# Patient Record
Sex: Female | Born: 1985 | Race: White | Hispanic: No | Marital: Married | Smoking: Never smoker
Health system: Southern US, Community
[De-identification: ages and names within clinical notes are randomized; demographics above are authoritative.]

## PROBLEM LIST (undated history)

## (undated) DIAGNOSIS — Z1379 Encounter for other screening for genetic and chromosomal anomalies: Secondary | ICD-10-CM

## (undated) DIAGNOSIS — Z803 Family history of malignant neoplasm of breast: Secondary | ICD-10-CM

## (undated) DIAGNOSIS — Z9189 Other specified personal risk factors, not elsewhere classified: Secondary | ICD-10-CM

## (undated) HISTORY — DX: Family history of malignant neoplasm of breast: Z80.3

## (undated) HISTORY — DX: Other specified personal risk factors, not elsewhere classified: Z91.89

## (undated) HISTORY — DX: Encounter for other screening for genetic and chromosomal anomalies: Z13.79

---
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
## 2004-10-09 ENCOUNTER — Other Ambulatory Visit: Admission: RE | Admit: 2004-10-09 | Discharge: 2004-10-09 | Payer: Self-pay | Admitting: Obstetrics & Gynecology

## 2014-05-05 HISTORY — PX: INTRAUTERINE DEVICE (IUD) INSERTION: SHX5877

## 2015-12-04 DIAGNOSIS — Z1379 Encounter for other screening for genetic and chromosomal anomalies: Secondary | ICD-10-CM

## 2015-12-04 DIAGNOSIS — Z9189 Other specified personal risk factors, not elsewhere classified: Secondary | ICD-10-CM

## 2015-12-04 HISTORY — DX: Encounter for other screening for genetic and chromosomal anomalies: Z13.79

## 2015-12-04 HISTORY — DX: Other specified personal risk factors, not elsewhere classified: Z91.89

## 2016-01-16 ENCOUNTER — Other Ambulatory Visit: Payer: Self-pay | Admitting: Obstetrics and Gynecology

## 2016-01-16 DIAGNOSIS — Z803 Family history of malignant neoplasm of breast: Secondary | ICD-10-CM

## 2016-01-16 DIAGNOSIS — Z9189 Other specified personal risk factors, not elsewhere classified: Secondary | ICD-10-CM

## 2016-01-24 ENCOUNTER — Other Ambulatory Visit: Payer: Self-pay | Admitting: Obstetrics and Gynecology

## 2016-01-24 ENCOUNTER — Ambulatory Visit
Admission: RE | Admit: 2016-01-24 | Discharge: 2016-01-24 | Disposition: A | Discharge status: Home / Self Care | Payer: BLUE CROSS/BLUE SHIELD | Source: Ambulatory Visit | Attending: Obstetrics and Gynecology | Admitting: Obstetrics and Gynecology

## 2016-01-24 DIAGNOSIS — Z1231 Encounter for screening mammogram for malignant neoplasm of breast: Secondary | ICD-10-CM | POA: Diagnosis present

## 2016-01-24 DIAGNOSIS — Z803 Family history of malignant neoplasm of breast: Secondary | ICD-10-CM

## 2016-01-24 DIAGNOSIS — Z9189 Other specified personal risk factors, not elsewhere classified: Secondary | ICD-10-CM

## 2016-01-29 ENCOUNTER — Other Ambulatory Visit: Payer: Self-pay | Admitting: Obstetrics and Gynecology

## 2016-01-29 DIAGNOSIS — R928 Other abnormal and inconclusive findings on diagnostic imaging of breast: Secondary | ICD-10-CM

## 2016-02-09 ENCOUNTER — Ambulatory Visit
Admission: RE | Admit: 2016-02-09 | Discharge: 2016-02-09 | Disposition: A | Discharge status: Home / Self Care | Payer: BLUE CROSS/BLUE SHIELD | Source: Ambulatory Visit | Attending: Obstetrics and Gynecology | Admitting: Obstetrics and Gynecology

## 2016-02-09 DIAGNOSIS — R928 Other abnormal and inconclusive findings on diagnostic imaging of breast: Secondary | ICD-10-CM | POA: Diagnosis present

## 2016-02-09 DIAGNOSIS — N6310 Unspecified lump in the right breast, unspecified quadrant: Secondary | ICD-10-CM | POA: Insufficient documentation

## 2016-02-09 DIAGNOSIS — Z803 Family history of malignant neoplasm of breast: Secondary | ICD-10-CM | POA: Insufficient documentation

## 2016-02-09 DIAGNOSIS — N63 Unspecified lump in breast: Secondary | ICD-10-CM | POA: Insufficient documentation

## 2016-02-16 ENCOUNTER — Other Ambulatory Visit: Payer: Self-pay | Admitting: Obstetrics and Gynecology

## 2016-02-16 DIAGNOSIS — N6009 Solitary cyst of unspecified breast: Secondary | ICD-10-CM

## 2016-08-12 ENCOUNTER — Ambulatory Visit: Payer: BLUE CROSS/BLUE SHIELD

## 2016-08-13 ENCOUNTER — Ambulatory Visit
Admission: RE | Admit: 2016-08-13 | Discharge: 2016-08-13 | Disposition: A | Discharge status: Home / Self Care | Payer: BLUE CROSS/BLUE SHIELD | Source: Ambulatory Visit | Attending: Obstetrics and Gynecology | Admitting: Obstetrics and Gynecology

## 2016-08-13 DIAGNOSIS — N6009 Solitary cyst of unspecified breast: Secondary | ICD-10-CM | POA: Diagnosis present

## 2016-08-13 DIAGNOSIS — N6312 Unspecified lump in the right breast, upper inner quadrant: Secondary | ICD-10-CM | POA: Insufficient documentation

## 2016-08-16 ENCOUNTER — Other Ambulatory Visit: Payer: Self-pay | Admitting: Obstetrics and Gynecology

## 2016-08-16 DIAGNOSIS — N63 Unspecified lump in unspecified breast: Secondary | ICD-10-CM

## 2016-12-24 ENCOUNTER — Encounter: Payer: Self-pay | Admitting: Obstetrics and Gynecology

## 2016-12-24 ENCOUNTER — Ambulatory Visit (INDEPENDENT_AMBULATORY_CARE_PROVIDER_SITE_OTHER): Payer: BLUE CROSS/BLUE SHIELD | Admitting: Obstetrics and Gynecology

## 2016-12-24 VITALS — BP 126/72 | Ht 66.0 in | Wt 244.0 lb

## 2016-12-24 DIAGNOSIS — Z9189 Other specified personal risk factors, not elsewhere classified: Secondary | ICD-10-CM

## 2016-12-24 DIAGNOSIS — Z1231 Encounter for screening mammogram for malignant neoplasm of breast: Secondary | ICD-10-CM

## 2016-12-24 DIAGNOSIS — Z30431 Encounter for routine checking of intrauterine contraceptive device: Secondary | ICD-10-CM

## 2016-12-24 DIAGNOSIS — N6310 Unspecified lump in the right breast, unspecified quadrant: Secondary | ICD-10-CM | POA: Diagnosis not present

## 2016-12-24 DIAGNOSIS — Z803 Family history of malignant neoplasm of breast: Secondary | ICD-10-CM

## 2016-12-24 DIAGNOSIS — Z01419 Encounter for gynecological examination (general) (routine) without abnormal findings: Secondary | ICD-10-CM

## 2016-12-24 DIAGNOSIS — Z1239 Encounter for other screening for malignant neoplasm of breast: Secondary | ICD-10-CM

## 2016-12-24 NOTE — Progress Notes (Signed)
Chief Complaint  Patient presents with  . Annual Exam     HPI:      [name] is a 31 y.o. No obstetric history on file. who LMP was No LMP recorded. Patient is not currently having periods (Reason: IUD)., presents today for her annual examination.  Her menses are absent due to IUD. Dysmenorrhea none. She does not have intermenstrual bleeding.  Sex activity: single partner, contraception - IUD. Mirena placed 10/15. Last Pap: Dec 04, 2015  Results were: no abnormalities /neg HPV DNA  Hx of STDs: none  Last mammogram: February 09, 2016  Results were: birads 3 due to RT breast mass. 6 mo u/s f/u was stable. Pt due for yearly mammo and RT breast u/s. There is a FH of breast cancer in her PGM. Pt is My Risk neg 2017. IBIS=21.7%. There is no FH of ovarian cancer. The patient does not do self-breast exams.  Tobacco use: The patient denies current or previous tobacco use. Alcohol use: none Exercise: moderately active  She does not get adequate calcium and Vitamin D in her diet.   Past Medical History:  Diagnosis Date  . Family history of breast cancer   . Genetic testing 12/2015   My Risk/BRCA neg  . Increased risk of breast cancer 12/2015   IBIS=21.7%    Past Surgical History:  Procedure Laterality Date  . INTRAUTERINE DEVICE (IUD) INSERTION  05/2014   Mirena    Family History  Problem Relation Age of Onset  . Breast cancer Paternal Grandmother 62  . Pancreatic cancer Paternal Grandmother   . Diabetes Paternal Grandmother   . Diabetes Mother   . Hyperlipidemia Mother   . Hypertension Mother   . Diabetes Father   . Hyperlipidemia Maternal Uncle   . Hypertension Maternal Uncle   . Hyperlipidemia Maternal Grandfather   . Hypertension Maternal Grandfather   . Lymphoma Paternal Grandfather     Social History   Social History  . Marital status: Single    Spouse name: N/A  . Number of children: N/A  . Years of education: N/A   Occupational History  . Not on  file.   Social History Main Topics  . Smoking status: Never Smoker  . Smokeless tobacco: Never Used  . Alcohol use Yes  . Drug use: No  . Sexual activity: Yes    Birth control/ protection: IUD   Other Topics Concern  . Not on file   Social History Narrative  . No narrative on file     Current Outpatient Prescriptions:  .  [START ON 05/19/2017] levonorgestrel (MIRENA) 20 MCG/24HR IUD, 1 each by Intrauterine route once., Disp: , Rfl:   ROS:  Review of Systems  Constitutional: Negative for fatigue, fever and unexpected weight change.  Respiratory: Negative for cough, shortness of breath and wheezing.   Cardiovascular: Negative for chest pain, palpitations and leg swelling.  Gastrointestinal: Negative for blood in stool, constipation, diarrhea, nausea and vomiting.  Endocrine: Negative for cold intolerance, heat intolerance and polyuria.  Genitourinary: Negative for dyspareunia, dysuria, flank pain, frequency, genital sores, hematuria, menstrual problem, pelvic pain, urgency, vaginal bleeding, vaginal discharge and vaginal pain.  Musculoskeletal: Negative for back pain, joint swelling and myalgias.  Skin: Negative for rash.  Neurological: Positive for headaches. Negative for dizziness, syncope, light-headedness and numbness.  Hematological: Negative for adenopathy.  Psychiatric/Behavioral: Negative for agitation, confusion, sleep disturbance and suicidal ideas. The patient is nervous/anxious.      Objective: BP 126/72  Ht 5' 6" (1.676 m)   Wt 244 lb (110.7 kg)   BMI 39.38 kg/m    Physical Exam  Constitutional: She is oriented to person, place, and time. She appears well-developed and well-nourished.  Genitourinary: Vagina normal and uterus normal. There is no rash or tenderness on the right labia. There is no rash or tenderness on the left labia. No erythema or tenderness in the vagina. No vaginal discharge found. Right adnexum does not display mass and does not display  tenderness. Left adnexum does not display mass and does not display tenderness. Cervix does not exhibit motion tenderness or polyp. Uterus is not enlarged or tender.  Neck: Normal range of motion. No thyromegaly present.  Cardiovascular: Normal rate, regular rhythm and normal heart sounds.   No murmur heard. Pulmonary/Chest: Effort normal and breath sounds normal. Right breast exhibits no mass, no nipple discharge, no skin change and no tenderness. Left breast exhibits no mass, no nipple discharge, no skin change and no tenderness.  Abdominal: Soft. There is no tenderness. There is no guarding.  Musculoskeletal: Normal range of motion.  Neurological: She is alert and oriented to person, place, and time. No cranial nerve deficit.  Psychiatric: She has a normal mood and affect. Her behavior is normal.  Vitals reviewed.   Assessment/Plan: Encounter for annual routine gynecological examination  Encounter for routine checking of intrauterine contraceptive device (IUD) - IUD in place. Due for removal 10/20.  Screening for breast cancer - Plan: MM DIAG BREAST TOMO BILATERAL  Breast mass, right - Most likely benign. Due for repeat breast u/s 7/18 and bilat scr mammo.  - Plan: MM DIAG BREAST TOMO BILATERAL  Increased risk of breast cancer - IBIS=21.7%. Cont SBE, yearly CBE and mammos, scr breast MRI if pt interested. Retry Vit D supp (had nausea with 1 brand).   Family history of breast cancer - Pt is My Risk neg 2017.             GYN counsel breast self exam, mammography screening, adequate intake of calcium and vitamin D     F/U  Return in about 1 year (around 12/24/2017).  Alicia B. Copland, PA-C 12/24/2016 10:37 AM

## 2017-02-11 ENCOUNTER — Ambulatory Visit
Admission: RE | Admit: 2017-02-11 | Discharge: 2017-02-11 | Disposition: A | Discharge status: Home / Self Care | Payer: BLUE CROSS/BLUE SHIELD | Source: Ambulatory Visit | Attending: Obstetrics and Gynecology | Admitting: Obstetrics and Gynecology

## 2017-02-11 ENCOUNTER — Telehealth: Payer: Self-pay | Admitting: Obstetrics and Gynecology

## 2017-02-11 DIAGNOSIS — N6310 Unspecified lump in the right breast, unspecified quadrant: Secondary | ICD-10-CM

## 2017-02-11 DIAGNOSIS — N6312 Unspecified lump in the right breast, upper inner quadrant: Secondary | ICD-10-CM | POA: Insufficient documentation

## 2017-02-11 DIAGNOSIS — N63 Unspecified lump in unspecified breast: Secondary | ICD-10-CM

## 2017-02-11 DIAGNOSIS — Z1239 Encounter for other screening for malignant neoplasm of breast: Secondary | ICD-10-CM

## 2017-02-11 NOTE — Telephone Encounter (Signed)
Pt is calling back to speak with Helmut Musterlicia about her results. Please call patient

## 2017-02-11 NOTE — Telephone Encounter (Signed)
LMTRC

## 2017-02-12 NOTE — Telephone Encounter (Signed)
LMTRC

## 2017-02-17 NOTE — Telephone Encounter (Signed)
Pt aware of Cat 3 stable mammo for RT breast mass. Ok waiting 1 yr for repeat mammo and u/s. Order in computer. Pt also aware she qualifies for screening breast MRI due to increased risk of breast cancer due to FH. She will check with ins and f/u for sched if desires.

## 2017-02-17 NOTE — Telephone Encounter (Signed)
Patient returned call, would like cb on cell [phone] and if she doesn't pick up if you could try her work [phone].

## 2018-01-29 ENCOUNTER — Ambulatory Visit (INDEPENDENT_AMBULATORY_CARE_PROVIDER_SITE_OTHER): Payer: Managed Care, Other (non HMO) | Admitting: Certified Nurse Midwife

## 2018-01-29 ENCOUNTER — Encounter: Payer: Self-pay | Admitting: Certified Nurse Midwife

## 2018-01-29 VITALS — BP 109/73 | HR 70 | Ht 66.0 in | Wt 233.6 lb

## 2018-01-29 DIAGNOSIS — Z6837 Body mass index (BMI) 37.0-37.9, adult: Secondary | ICD-10-CM | POA: Diagnosis not present

## 2018-01-29 DIAGNOSIS — Z3169 Encounter for other general counseling and advice on procreation: Secondary | ICD-10-CM

## 2018-01-29 DIAGNOSIS — Z9189 Other specified personal risk factors, not elsewhere classified: Secondary | ICD-10-CM

## 2018-01-29 DIAGNOSIS — Z803 Family history of malignant neoplasm of breast: Secondary | ICD-10-CM

## 2018-01-29 DIAGNOSIS — Z01419 Encounter for gynecological examination (general) (routine) without abnormal findings: Secondary | ICD-10-CM | POA: Diagnosis not present

## 2018-01-29 MED ORDER — VITAMIN D (ERGOCALCIFEROL) 1.25 MG (50000 UNIT) PO CAPS: 50000.0000 [IU] | ORAL_CAPSULE | ORAL | 1 refills | Status: DC

## 2018-01-29 NOTE — Progress Notes (Signed)
ANNUAL PREVENTATIVE CARE GYN  ENCOUNTER NOTE  Subjective:       Shelia Daniels is a 32 y.o. G0P0000 female here for a routine annual gynecologic exam. No current complaints. Desires pregnancy before end of year.   Denies difficulty breathing or respiratory distress, chest pain, abdominal pain, vaginal bleeding, dysuria, and leg pain or swelling.    Gynecologic History  No LMP recorded. (Menstrual status: IUD).  Contraception: IUD, Mirena  Last Pap: 12/2015. Results were: Negative/Negative  Last mammogram: 02/2017. Results were: BI-RADS 3  Obstetric History  OB History  Gravida Para Term Preterm AB Living  0 0 0 0 0 0  SAB TAB Ectopic Multiple Live Births  0 0 0 0 0    Past Medical History:  Diagnosis Date  . Family history of breast cancer   . Genetic testing 12/2015   My Risk/BRCA neg  . Increased risk of breast cancer 12/2015   IBIS=21.7%    Past Surgical History:  Procedure Laterality Date  . INTRAUTERINE DEVICE (IUD) INSERTION  05/2014   Mirena    Current Outpatient Medications on File Prior to Visit  Medication Sig Dispense Refill  . fluticasone (FLONASE) 50 MCG/ACT nasal spray Place into the nose.    . levonorgestrel (MIRENA) 20 MCG/24HR IUD 1 each by Intrauterine route once.    . Multiple Vitamin (MULTIVITAMIN) tablet Take 1 tablet by mouth daily.     No current facility-administered medications on file prior to visit.     Allergies  Allergen Reactions  . Pollen Extract Other (See Comments)    Sneezing, watery eyes, seasonal    Social History   Socioeconomic History  . Marital status: Single    Spouse name: Not on file  . Number of children: Not on file  . Years of education: Not on file  . Highest education level: Not on file  Occupational History  . Not on file  Social Needs  . Financial resource strain: Not on file  . Food insecurity:    Worry: Not on file    Inability: Not on file  . Transportation needs:    Medical: Not on file   Non-medical: Not on file  Tobacco Use  . Smoking status: Never Smoker  . Smokeless tobacco: Never Used  Substance and Sexual Activity  . Alcohol use: Yes  . Drug use: No  . Sexual activity: Yes    Birth control/protection: IUD  Lifestyle  . Physical activity:    Days per week: Not on file    Minutes per session: Not on file  . Stress: Not on file  Relationships  . Social connections:    Talks on phone: Not on file    Gets together: Not on file    Attends religious service: Not on file    Active member of club or organization: Not on file    Attends meetings of clubs or organizations: Not on file    Relationship status: Not on file  . Intimate partner violence:    Fear of current or ex partner: Not on file    Emotionally abused: Not on file    Physically abused: Not on file    Forced sexual activity: Not on file  Other Topics Concern  . Not on file  Social History Narrative  . Not on file    Family History  Problem Relation Age of Onset  . Breast cancer Paternal Grandmother 34  . Pancreatic cancer Paternal Grandmother   . Diabetes Paternal Grandmother   .  Diabetes Mother   . Hyperlipidemia Mother   . Hypertension Mother   . Diabetes Father   . Heart attack Father   . Hyperlipidemia Maternal Uncle   . Hypertension Maternal Uncle   . Hyperlipidemia Maternal Grandfather   . Hypertension Maternal Grandfather   . Lymphoma Paternal Grandfather     The following portions of the patient's history were reviewed and updated as appropriate: allergies, current medications, past family history, past medical history, past social history, past surgical history and problem list.  Review of Systems  ROS negative except as noted above. Information obtained from patient.    Objective:   BP 109/73   Pulse 70   Ht 5' 6"  (1.676 m)   Wt 233 lb 9 oz (105.9 kg)   BMI 37.70 kg/m   CONSTITUTIONAL: Well-developed, well-nourished female in no acute distress.   PSYCHIATRIC: Normal  mood and affect. Normal behavior. Normal judgment and thought content.  [place]: Alert and oriented to person, place, and time. Normal muscle tone coordination. No cranial  nerve deficit noted.  HENT:  Normocephalic, atraumatic, External right and left ear normal.   EYES: Conjunctivae and EOM are normal. Pupils are equal and round.   NECK: Normal range of motion, supple, no masses.  Normal thyroid.   SKIN: Skin is warm and dry. No rash noted. Not diaphoretic. No erythema. No pallor.  CARDIOVASCULAR: Normal heart rate noted, regular rhythm, no murmur.  RESPIRATORY: Clear to auscultation bilaterally. Effort and breath sounds normal, no problems with respiration noted.  BREASTS: Symmetric in size. No masses, skin changes, nipple drainage, or lymphadenopathy.  ABDOMEN: Soft, normal bowel sounds, no distention noted.  No tenderness, rebound or guarding.   PELVIC:  External Genitalia: Normal  Vagina: Normal  Cervix: Normal, IUD strings present  Uterus: Normal  Adnexa: Normal  MUSCULOSKELETAL: Normal range of motion. No tenderness.  No cyanosis, clubbing, or edema.  2+ distal pulses.  LYMPHATIC: No Axillary, Supraclavicular, or Inguinal Adenopathy.  Assessment:   Annual gynecologic examination 32 y.o.   Contraception: IUD, Mirena due for removal 2020   Obesity 1   Problem List Items Addressed This Visit      Other   Increased risk of breast cancer   Relevant Orders   MM DIGITAL SCREENING BILATERAL   Family history of breast cancer   Relevant Orders   MM DIGITAL SCREENING BILATERAL    Other Visit Diagnoses    Well woman exam    -  Primary   Relevant Orders   Thyroid Panel With TSH   CBC   Comprehensive metabolic panel   Lipid panel   Hemoglobin A1c   MM DIGITAL SCREENING BILATERAL   BMI 37.0-37.9, adult       Relevant Orders   Thyroid Panel With TSH   CBC   Comprehensive metabolic panel   Lipid panel   Hemoglobin A1c   Encounter for preconception  consultation          Plan:   Pap: Not needed  Mammogram: Ordered  Labs: See orders  Routine preventative health maintenance measures emphasized: Exercise/Diet/Weight control, Tobacco Warnings, Alcohol/Substance use risks and Stress Management; see AVS  Reviewed red flag symptoms and when to call  RTC x 1 year for Annual Exam or sooner if needed   Diona Fanti, CNM [facility], [facility]

## 2018-01-29 NOTE — Patient Instructions (Addendum)

## 2018-01-29 NOTE — Progress Notes (Signed)
New pt is here for an annual exam. No history of abnormal pap smears. Mirena in place x 4 years. Has had one for 9 years total.

## 2018-01-30 ENCOUNTER — Encounter: Payer: Self-pay | Admitting: Certified Nurse Midwife

## 2018-01-30 DIAGNOSIS — R7303 Prediabetes: Secondary | ICD-10-CM | POA: Insufficient documentation

## 2018-01-30 LAB — COMPREHENSIVE METABOLIC PANEL
ALBUMIN: 4.5 g/dL (ref 3.5–5.5)
ALK PHOS: 64 IU/L (ref 39–117)
ALT: 16 IU/L (ref 0–32)
AST: 15 IU/L (ref 0–40)
Albumin/Globulin Ratio: 2.1 (ref 1.2–2.2)
BILIRUBIN TOTAL: 0.3 mg/dL (ref 0.0–1.2)
BUN / CREAT RATIO: 15 (ref 9–23)
BUN: 12 mg/dL (ref 6–20)
CO2: 20 mmol/L (ref 20–29)
CREATININE: 0.81 mg/dL (ref 0.57–1.00)
Calcium: 9.2 mg/dL (ref 8.7–10.2)
Chloride: 107 mmol/L — ABNORMAL HIGH (ref 96–106)
GFR calc Af Amer: 110 mL/min/{1.73_m2} (ref >59)
GFR calc non Af Amer: 95 mL/min/{1.73_m2} (ref >59)
GLOBULIN, TOTAL: 2.1 g/dL (ref 1.5–4.5)
Glucose: 98 mg/dL (ref 65–99)
POTASSIUM: 4.4 mmol/L (ref 3.5–5.2)
Sodium: 140 mmol/L (ref 134–144)
Total Protein: 6.6 g/dL (ref 6.0–8.5)

## 2018-01-30 LAB — CBC
HEMATOCRIT: 37.6 % (ref 34.0–46.6)
HEMOGLOBIN: 12.6 g/dL (ref 11.1–15.9)
MCH: 28.6 pg (ref 26.6–33.0)
MCHC: 33.5 g/dL (ref 31.5–35.7)
MCV: 86 fL (ref 79–97)
Platelets: 292 10*3/uL (ref 150–450)
RBC: 4.4 x10E6/uL (ref 3.77–5.28)
RDW: 14.1 % (ref 12.3–15.4)
WBC: 5 10*3/uL (ref 3.4–10.8)

## 2018-01-30 LAB — THYROID PANEL WITH TSH
FREE THYROXINE INDEX: 2.3 (ref 1.2–4.9)
T3 Uptake Ratio: 28 % (ref 24–39)
T4, Total: 8.1 ug/dL (ref 4.5–12.0)
TSH: 2.86 u[IU]/mL (ref 0.450–4.500)

## 2018-01-30 LAB — LIPID PANEL
CHOLESTEROL TOTAL: 161 mg/dL (ref 100–199)
Chol/HDL Ratio: 3.7 ratio (ref 0.0–4.4)
HDL: 44 mg/dL (ref >39)
LDL Calculated: 101 mg/dL — ABNORMAL HIGH (ref 0–99)
TRIGLYCERIDES: 79 mg/dL (ref 0–149)
VLDL CHOLESTEROL CAL: 16 mg/dL (ref 5–40)

## 2018-01-30 LAB — HEMOGLOBIN A1C
Est. average glucose Bld gHb Est-mCnc: 117 mg/dL
Hgb A1c MFr Bld: 5.7 % — ABNORMAL HIGH (ref 4.8–5.6)

## 2018-02-12 ENCOUNTER — Other Ambulatory Visit: Payer: BLUE CROSS/BLUE SHIELD

## 2018-02-16 ENCOUNTER — Encounter: Payer: Self-pay | Admitting: Certified Nurse Midwife

## 2018-02-17 ENCOUNTER — Other Ambulatory Visit: Payer: Self-pay

## 2018-02-17 MED ORDER — VITAMIN D (ERGOCALCIFEROL) 1.25 MG (50000 UNIT) PO CAPS: 50000.0000 [IU] | ORAL_CAPSULE | ORAL | 6 refills | Status: DC

## 2018-02-21 ENCOUNTER — Other Ambulatory Visit: Payer: Self-pay | Admitting: Certified Nurse Midwife

## 2018-02-21 DIAGNOSIS — R7303 Prediabetes: Secondary | ICD-10-CM

## 2018-03-05 ENCOUNTER — Encounter: Payer: Self-pay | Admitting: Obstetrics and Gynecology

## 2018-03-05 ENCOUNTER — Ambulatory Visit
Admission: RE | Admit: 2018-03-05 | Discharge: 2018-03-05 | Disposition: A | Discharge status: Home / Self Care | Payer: Managed Care, Other (non HMO) | Source: Ambulatory Visit | Attending: Obstetrics and Gynecology | Admitting: Obstetrics and Gynecology

## 2018-03-05 DIAGNOSIS — N6310 Unspecified lump in the right breast, unspecified quadrant: Secondary | ICD-10-CM | POA: Diagnosis not present

## 2018-03-25 ENCOUNTER — Encounter: Payer: Self-pay | Admitting: Dietician

## 2018-03-25 ENCOUNTER — Encounter: Payer: Managed Care, Other (non HMO) | Attending: Certified Nurse Midwife | Admitting: Dietician

## 2018-03-25 VITALS — Ht 66.0 in | Wt 235.4 lb

## 2018-03-25 DIAGNOSIS — R7303 Prediabetes: Secondary | ICD-10-CM | POA: Insufficient documentation

## 2018-03-25 DIAGNOSIS — Z713 Dietary counseling and surveillance: Secondary | ICD-10-CM | POA: Insufficient documentation

## 2018-03-25 DIAGNOSIS — E6609 Other obesity due to excess calories: Secondary | ICD-10-CM

## 2018-03-25 DIAGNOSIS — Z6837 Body mass index (BMI) 37.0-37.9, adult: Secondary | ICD-10-CM

## 2018-03-25 NOTE — Progress Notes (Signed)
Medical Nutrition Therapy: Visit start time: 7793  end time: 1145 Assessment:  Diagnosis: pre-diabetes Past medical history: none significant Psychosocial issues/ stress concerns: patient voices high stress level, does engage in excess eating when stressed.   Preferred learning method:  . Auditory . Visual   Current weight: 235.4lbs Height: 5'6" Medications, supplements: reconciled list in medical record  Progress and evaluation: Patient reports gradual increase in blood sugar and lipids over several years, and reports strong family history for diabetes and heart disease, wants to work to prevent chronic illness.  Reports stable weight for about 1 year; her goal is to lose weight to goal of 175lbs. She has participated in Weight Watchers in the past, liked having accountability and was successful with weight loss, but has regained weight with change in job responsibilities, now more sedentary work. She reports tendency for binge-eating when husband is out of town with his work, and some high calorie snacks.   Physical activity: averaging 30-45 minutes, 1x a week. 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 Dining out frequency: 6-8 meals per week.  Breakfast: breakfast casserole; fast food; snack foods ie brownie and choc milk -- depends on time Snack: none Lunch: recently salmon 1-2x a week (order at work); leftovers; sometimes granola bar due to work schedule; rarely fast food or other sandwich mayb 1x a week Snack: sometimes, granola bar or peanut butter with chocolate chips Supper: when cooks -- chicken, pork chop with vegetables (frozen or canned), mac and cheese, spaghetti, pizza; quick meal when eating alone ie hot dogs and chips, tendency to overeat when husband is gone.  Snack: sometimes, same as pm -- craves sweets Beverages: water, sometimes diet soda small amount, iced coffee  drink  Nutrition Care Education: Topics covered: diabetes prevention, heart health, weight control Basic nutrition: basic food groups, appropriate nutrient balance, appropriate meal and snack schedule, general nutrition guidelines    Weight control: importance of low fat and low sugar foods; stress eating; making manageable changes; pre-prep planning for meals; quick and easy meal options Diabetes prevention: appropriate meal and snack schedule, appropriate carb intake and balance within general guidelines, controlling sugar intake Hyperlipidemia:  target goals for lipids, healthy and unhealthy fats, role of fiber food sources of phytochemicals  Nutritional Diagnosis:  [place]-2.2 Altered nutrition-related laboratory As related to pre-diabetes.  As evidenced by pateint with recent HbA1C of 5.7%; also with recent LDL cholesterol of 131m/dl. [place]-3.3 Overweight/obesity As related to excess calories and decreased physical activity.  As evidenced by patient with current BMI of 37.99, and patient report of dietary history and physical activity.  Intervention: Instruction as noted above.    Patient voices concern with being able to change some current diet issues such as large portions when eating alone.    Set goals to promote manageable changes and avoid feeling overwhelmed.    Patient plans to meet with employee counseling service to help with stress management.   Education Materials given:  . General diet guidelines for Diabetes . Plate Planner with food lists . Recipes-- Build a PAlbertson's. [employer]. Meal Prepping handout . Goals/ instructions   Learner/ who was taught:  . Patient   Level of understanding: .Marland KitchenVerbalizes/ demonstrates competency   Demonstrated degree of understanding via:   Teach back Learning barriers:  Willingness to learn/ readiness for change: . Acceptance, ready for change if not overwhelming, large changes  Monitoring and Evaluation:  Dietary  intake, exercise, BG control, and body weight      follow up: 05/01/18

## 2018-03-25 NOTE — Patient Instructions (Signed)
   Keep some convenient frozen meals or other healthy options on hand at work for lunches, and at home for breakfast.   Start prepping one meal each week.   Consider planning ahead for husband's out of town trips by having quick meal and snack options at home beforehand.

## 2018-03-26 ENCOUNTER — Ambulatory Visit: Payer: Managed Care, Other (non HMO) | Admitting: Certified Nurse Midwife

## 2018-03-26 VITALS — BP 110/66 | HR 68 | Ht 66.0 in | Wt 235.0 lb

## 2018-03-26 DIAGNOSIS — Z30432 Encounter for removal of intrauterine contraceptive device: Secondary | ICD-10-CM

## 2018-03-26 NOTE — Progress Notes (Signed)
Shelia Daniels is a 32 y.o. year old G0P0000 Caucasian female who presents for removal of a Mirena IUD. Her Mirena IUD was placed 2015.   BP 110/66   Pulse 68   Ht 5\' 6"  (1.676 m)   Wt 235 lb (106.6 kg)   BMI 37.93 kg/m   Time out was performed.  A small plastic speculum was placed in the vagina.  The cervix was visualized, and the strings were visible. They were grasped and the Mirena was easily removed intact without complications.   Preconception counseling, see AVS.   F/U as needed.    Gunnar BullaJenkins [name], CNM [facility], [facility]

## 2018-03-26 NOTE — Progress Notes (Signed)
Pt is here to have Mirena IUD removed. Would like to conceive.

## 2018-03-26 NOTE — Patient Instructions (Signed)
Preparing for Pregnancy If you are considering becoming pregnant, make an appointment to see your regular health care provider to learn how to prepare for a safe and healthy pregnancy (preconception care). During a preconception care visit, your health care provider will:  Do a complete physical exam, including a Pap test.  Take a complete medical history.  Give you information, answer your questions, and help you resolve problems.  Preconception checklist Medical history  Tell your health care provider about any current or past medical conditions. Your pregnancy or your ability to become pregnant may be affected by chronic conditions, such as diabetes, chronic hypertension, and thyroid problems.  Include your family's medical history as well as your partner's medical history.  Tell your health care provider about any history of STIs (sexually transmitted infections).These can affect your pregnancy. In some cases, they can be passed to your baby. Discuss any concerns that you have about STIs.  If indicated, discuss the benefits of genetic testing. This testing will show whether there are any genetic conditions that may be passed from you or your partner to your baby.  Tell your health care provider about: ? Any problems you have had with conception or pregnancy. ? Any medicines you take. These include vitamins, herbal supplements, and over-the-counter medicines. ? Your history of immunizations. Discuss any vaccinations that you may need.  Diet  Ask your health care provider what to include in a healthy diet that has a balance of nutrients. This is especially important when you are pregnant or preparing to become pregnant.  Ask your health care provider to help you reach a healthy weight before pregnancy. ? If you are overweight, you may be at higher risk for certain complications, such as high blood pressure, diabetes, and preterm birth. ? If you are underweight, you are more likely  to have a baby who has a low birth weight.  Lifestyle, work, and home  Let your health care provider know: ? About any lifestyle habits that you have, such as alcohol use, drug use, or smoking. ? About recreational activities that may put you at risk during pregnancy, such as downhill skiing and certain exercise programs. ? Tell your health care provider about any international travel, especially any travel to places with an active Zika virus outbreak. ? About harmful substances that you may be exposed to at work or at home. These include chemicals, pesticides, radiation, or even litter boxes. ? If you do not feel safe at home.  Mental health  Tell your health care provider about: ? Any history of mental health conditions, including feelings of depression, sadness, or anxiety. ? Any medicines that you take for a mental health condition. These include herbs and supplements.  Home instructions to prepare for pregnancy Lifestyle  Eat a balanced diet. This includes fresh fruits and vegetables, whole grains, lean meats, low-fat dairy products, healthy fats, and foods that are high in fiber. Ask to meet with a nutritionist or registered dietitian for assistance with meal planning and goals.  Get regular exercise. Try to be active for at least 30 minutes a day on most days of the week. Ask your health care provider which activities are safe during pregnancy.  Do not use any products that contain nicotine or tobacco, such as cigarettes and e-cigarettes. If you need help quitting, ask your health care provider.  Do not drink alcohol.  Do not take illegal drugs.  Maintain a healthy weight. Ask your health care provider what weight range is   right for you.  General instructions  Keep an accurate record of your menstrual periods. This makes it easier for your health care provider to determine your baby's due date.  Begin taking prenatal vitamins and folic acid supplements daily as directed by  your health care provider.  Manage any chronic conditions, such as high blood pressure and diabetes, as told by your health care provider. This is important.  How do I know that I am pregnant? You may be pregnant if you have been sexually active and you miss your period. Symptoms of early pregnancy include:  Mild cramping.  Very light vaginal bleeding (spotting).  Feeling unusually tired.  Nausea and vomiting (morning sickness).  If you have any of these symptoms and you suspect that you might be pregnant, you can take a home pregnancy test. These tests check for a hormone in your urine (human chorionic gonadotropin, or hCG). A woman's body begins to make this hormone during early pregnancy. These tests are very accurate. Wait until at least the first day after you miss your period to take one. If the test shows that you are pregnant (you get a positive result), call your health care provider to make an appointment for prenatal care. What should I do if I become pregnant?  Make an appointment with your health care provider as soon as you suspect you are pregnant.  Do not use any products that contain nicotine, such as cigarettes, chewing tobacco, and e-cigarettes. If you need help quitting, ask your health care provider.  Do not drink alcoholic beverages. Alcohol is related to a number of birth defects.  Avoid toxic odors and chemicals.  You may continue to have sexual intercourse if it does not cause pain or other problems, such as vaginal bleeding. This information is not intended to replace advice given to you by your health care provider. Make sure you discuss any questions you have with your health care provider. Document Released: 07/04/2008 Document Revised: 03/19/2016 Document Reviewed: 02/11/2016 Elsevier Interactive Patient Education  2018 [employer]  

## 2018-05-01 ENCOUNTER — Ambulatory Visit: Payer: Managed Care, Other (non HMO) | Admitting: Dietician

## 2018-05-07 ENCOUNTER — Encounter: Payer: Self-pay | Admitting: Dietician

## 2018-05-07 NOTE — Progress Notes (Signed)
Patient cancelled her appointment for 05/01/18 and did not wish to reschedule. Sent letter to referring provider.

## 2018-06-21 ENCOUNTER — Encounter: Payer: Self-pay | Admitting: Certified Nurse Midwife

## 2018-06-30 ENCOUNTER — Encounter: Payer: Self-pay | Admitting: Certified Nurse Midwife

## 2018-06-30 ENCOUNTER — Ambulatory Visit (INDEPENDENT_AMBULATORY_CARE_PROVIDER_SITE_OTHER): Payer: Managed Care, Other (non HMO) | Admitting: Certified Nurse Midwife

## 2018-06-30 VITALS — BP 108/75 | HR 77 | Ht 66.0 in | Wt 243.3 lb

## 2018-06-30 DIAGNOSIS — N912 Amenorrhea, unspecified: Secondary | ICD-10-CM

## 2018-06-30 LAB — POCT URINE PREGNANCY: Preg Test, Ur: POSITIVE — AB

## 2018-06-30 NOTE — Progress Notes (Signed)
GYN ENCOUNTER NOTE  Subjective:       Shelia Daniels is a 32 y.o. G73P0000 female here for pregnancy confirmation. Accompanied by spouse.   Reports positive home pregnancy test. Endorses mild nausea without vomiting.   Denies difficulty breathing or respiratory distress, chest pain, abdominal pain, vaginal bleeding, dysuria, and leg pain or swelling.    Gynecologic History  Patient's last menstrual period was 05/18/2018 (exact date).  Period Duration (Days): 3 Period Pattern: (!) Irregular Menstrual Flow: Light Menstrual Control: Tampon Dysmenorrhea: None  Gestational age: 64 weeks 1 day  Estimated date of birth: 02/22/2021   Contraception: none   Last Pap: 12/2015. Results were: Negative/Negative  Obstetric History  OB History  Gravida Para Term Preterm AB Living  1 0 0 0 0 0  SAB TAB Ectopic Multiple Live Births  0 0 0 0 0    # Outcome Date GA Lbr Len/2nd Weight Sex Delivery Anes PTL Lv  1 Current             Past Medical History:  Diagnosis Date  . Family history of breast cancer   . Genetic testing 12/2015   My Risk/BRCA neg  . Increased risk of breast cancer 12/2015   IBIS=21.7%    Past Surgical History:  Procedure Laterality Date  . INTRAUTERINE DEVICE (IUD) INSERTION  05/2014   Mirena    Current Outpatient Medications on File Prior to Visit  Medication Sig Dispense Refill  . Prenatal MV-Min-Fe Fum-FA-DHA (PRENATAL+DHA PO)     . Vitamin D, Ergocalciferol, (DRISDOL) 50000 units CAPS capsule Take 1 capsule (50,000 Units total) by mouth every 7 (seven) days. 90 capsule 6   No current facility-administered medications on file prior to visit.     Allergies  Allergen Reactions  . Pollen Extract Other (See Comments)    Sneezing, watery eyes, seasonal    Social History   Socioeconomic History  . Marital status: Married    Spouse name: Not on file  . Number of children: Not on file  . Years of education: Not on file  . Highest education level: Not  on file  Occupational History  . Not on file  Social Needs  . Financial resource strain: Not on file  . Food insecurity:    Worry: Not on file    Inability: Not on file  . Transportation needs:    Medical: Not on file    Non-medical: Not on file  Tobacco Use  . Smoking status: Never Smoker  . Smokeless tobacco: Never Used  Substance and Sexual Activity  . Alcohol use: Not Currently    Alcohol/week: 2.0 standard drinks    Types: 2 Standard drinks or equivalent per week  . Drug use: No  . Sexual activity: Yes    Birth control/protection: None  Lifestyle  . Physical activity:    Days per week: Not on file    Minutes per session: Not on file  . Stress: Not on file  Relationships  . Social connections:    Talks on phone: Not on file    Gets together: Not on file    Attends religious service: Not on file    Active member of club or organization: Not on file    Attends meetings of clubs or organizations: Not on file    Relationship status: Not on file  . Intimate partner violence:    Fear of current or ex partner: Not on file    Emotionally abused: Not on file  Physically abused: Not on file    Forced sexual activity: Not on file  Other Topics Concern  . Not on file  Social History Narrative  . Not on file    Family History  Problem Relation Age of Onset  . Breast cancer Paternal Grandmother 86  . Pancreatic cancer Paternal Grandmother   . Diabetes Paternal Grandmother   . Diabetes Mother   . Hyperlipidemia Mother   . Hypertension Mother   . Diabetes Father   . Heart attack Father   . Hyperlipidemia Maternal Uncle   . Hypertension Maternal Uncle   . Hyperlipidemia Maternal Grandfather   . Hypertension Maternal Grandfather   . Lymphoma Paternal Grandfather   . Ovarian cancer Neg Hx   . Colon cancer Neg Hx     The following portions of the patient's history were reviewed and updated as appropriate: allergies, current medications, past family history, past  medical history, past social history, past surgical history and problem list.  Review of Systems  ROS negative except as noted above. Information obtained from patient.   Objective:   BP 108/75   Pulse 77   Ht 5' 6"  (1.676 m)   Wt 243 lb 4.8 oz (110.4 kg)   LMP 05/18/2018 (Exact Date)   BMI 39.27 kg/m   GENERAL: Alert and oriented x 4, no apparent distress.   PHYSICAL EXAM: Not indicated.   Recent Results (from the past 2160 hour(s))  POCT urine pregnancy     Status: Abnormal   Collection Time: 06/30/18 10:48 AM  Result Value Ref Range   Preg Test, Ur Positive (A) Negative    Assessment:   1. Amenorrhea  - POCT urine pregnancy  Plan:   First trimester education, see AVS.   Reviewed red flag symptoms and when to call.   RTC x 1-2 weeks for dating/viability [place] and Nurse intake.   RTC x 6 weeks for NOB PE or sooner if needed.    Diona Fanti, CNM [facility], [facility]

## 2018-06-30 NOTE — Progress Notes (Signed)
Patient here for confirmation, c/o "slight nausea" but no vomiting.

## 2018-06-30 NOTE — Patient Instructions (Signed)
WHAT OB PATIENTS CAN EXPECT   Confirmation of pregnancy and ultrasound ordered if medically indicated-[redacted] weeks gestation  New OB (NOB) intake with nurse and New OB (NOB) labs- [redacted] weeks gestation  New OB (NOB) physical examination with provider- 11/[redacted] weeks gestation  Flu vaccine-[redacted] weeks gestation  Anatomy scan-[redacted] weeks gestation  Glucose tolerance test, blood work to test for anemia, T-dap vaccine-[redacted] weeks gestation  Vaginal swabs/cultures-STD/Group B strep-[redacted] weeks gestation  Appointments every 4 weeks until 28 weeks  Every 2 weeks from 28 weeks until 36 weeks  Weekly visits from 36 weeks until delivery  Common Medications Safe in Pregnancy  Acne:      Constipation:  Benzoyl Peroxide     Colace  Clindamycin      Dulcolax Suppository  Topica Erythromycin     Fibercon  Salicylic Acid      Metamucil         Miralax AVOID:        Senakot   Accutane    Cough:  Retin-A       Cough Drops  Tetracycline      Phenergan w/ Codeine if Rx  Minocycline      Robitussin (Plain & DM)  Antibiotics:     Crabs/Lice:  Ceclor       RID  Cephalosporins    AVOID:  E-Mycins      Kwell  Keflex  Macrobid/Macrodantin   Diarrhea:  Penicillin      Kao-Pectate  Zithromax      Imodium AD         PUSH FLUIDS AVOID:       Cipro     Fever:  Tetracycline      Tylenol (Regular or Extra  Minocycline       Strength)  Levaquin      Extra Strength-Do not          Exceed 8 tabs/24 hrs Caffeine:        <253m/day (equiv. To 1 cup of coffee or  approx. 3 12 oz sodas)         Gas: Cold/Hayfever:       Gas-X  Benadryl      Mylicon  Claritin       Phazyme  **Claritin-D        Chlor-Trimeton    Headaches:  Dimetapp      ASA-Free Excedrin  Drixoral-Non-Drowsy     Cold Compress  Mucinex (Guaifenasin)     Tylenol (Regular or Extra  Sudafed/Sudafed-12 Hour     Strength)  **Sudafed PE Pseudoephedrine   Tylenol Cold & Sinus     Vicks Vapor Rub  Zyrtec  **AVOID if Problems With Blood  Pressure         Heartburn: Avoid lying down for at least 1 hour after meals  Aciphex      Maalox     Rash:  Milk of Magnesia     Benadryl    Mylanta       1% Hydrocortisone Cream  Pepcid  Pepcid Complete   Sleep Aids:  Prevacid      Ambien   Prilosec       Benadryl  Rolaids       Chamomile Tea  Tums (Limit 4/day)     Unisom  Zantac       Tylenol PM         Warm milk-add vanilla or  Hemorrhoids:       Sugar for taste  Anusol/Anusol H.C.  (RX: Analapram 2.5%)  Sugar Substitutes:  Hydrocortisone OTC     Ok in moderation  Preparation H      Tucks        Vaseline lotion applied to tissue with wiping    Herpes:     Throat:  Acyclovir      Oragel  Famvir  Valtrex     Vaccines:         Flu Shot Leg Cramps:       *Gardasil  Benadryl      Hepatitis A         Hepatitis B Nasal Spray:       Pneumovax  Saline Nasal Spray     Polio Booster         Tetanus Nausea:       Tuberculosis test or PPD  Vitamin B6 25 mg TID   AVOID:    Dramamine      *Gardasil  Emetrol       Live Poliovirus  Ginger Root 250 mg QID    MMR (measles, mumps &  High Complex Carbs @ Bedtime    rebella)  Sea Bands-Accupressure    Varicella (Chickenpox)  Unisom 1/2 tab TID     *No known complications           If received before Pain:         Known pregnancy;   Darvocet       Resume series after  Lortab        Delivery  Percocet    Yeast:   Tramadol      Femstat  Tylenol 3      Gyne-lotrimin  Ultram       Monistat  Vicodin           MISC:         All Sunscreens           Hair Coloring/highlights          Insect Repellant's          (Including DEET)         Mystic Tans Eating Plan for Pregnant Women While you are pregnant, your body will require additional nutrition to help support your growing baby. It is recommended that you consume:  150 additional calories each day during your first trimester.  300 additional calories each day during your second trimester.  300 additional calories each day  during your third trimester.  Eating a healthy, well-balanced diet is very important for your health and for your baby's health. You also have a higher need for some vitamins and minerals, such as folic acid, calcium, iron, and vitamin D. What do I need to know about eating during pregnancy?  Do not try to lose weight or go on a diet during pregnancy.  Choose healthy, nutritious foods. Choose  of a sandwich with a glass of milk instead of a candy bar or a high-calorie sugar-sweetened beverage.  Limit your overall intake of foods that have "empty calories." These are foods that have little nutritional value, such as sweets, desserts, candies, sugar-sweetened beverages, and fried foods.  Eat a variety of foods, especially fruits and vegetables.  Take a prenatal vitamin to help meet the additional needs during pregnancy, specifically for folic acid, iron, calcium, and vitamin D.  Remember to stay active. Ask your health care provider for exercise recommendations that are specific to you.  Practice good food safety and cleanliness, such as washing your hands before you eat and after you prepare raw meat.  This helps to prevent foodborne illnesses, such as listeriosis, that can be very dangerous for your baby. Ask your health care provider for more information about listeriosis. What does 150 extra calories look like? Healthy options for an additional 150 calories each day could be any of the following:  Plain low-fat yogurt (6-8 oz) with  cup of berries.  1 apple with 2 teaspoons of peanut butter.  Cut-up vegetables with  cup of hummus.  Low-fat chocolate milk (8 oz or 1 cup).  1 string cheese with 1 medium orange.   of a peanut butter and jelly sandwich on whole-wheat bread (1 tsp of peanut butter).  For 300 calories, you could eat two of those healthy options each day. What is a healthy amount of weight to gain? The recommended amount of weight for you to gain is based on your  pre-pregnancy BMI. If your pre-pregnancy BMI was:  Less than 18 (underweight), you should gain 28-40 lb.  18-24.9 (normal), you should gain 25-35 lb.  25-29.9 (overweight), you should gain 15-25 lb.  Greater than 30 (obese), you should gain 11-20 lb.  What if I am having twins or multiples? Generally, pregnant women who will be having twins or multiples may need to increase their daily calories by 300-600 calories each day. The recommended range for total weight gain is 25-54 lb, depending on your pre-pregnancy BMI. Talk with your health care provider for specific guidance about additional nutritional needs, weight gain, and exercise during your pregnancy. What foods can I eat? Grains Any grains. Try to choose whole grains, such as whole-wheat bread, oatmeal, or brown rice. Vegetables Any vegetables. Try to eat a variety of colors and types of vegetables to get a full range of vitamins and minerals. Remember to wash your vegetables well before eating. Fruits Any fruits. Try to eat a variety of colors and types of fruit to get a full range of vitamins and minerals. Remember to wash your fruits well before eating. Meats and Other Protein Sources Lean meats, including chicken, [place], fish, and lean cuts of beef, veal, or pork. Make sure that all meats are cooked to "well done." Tofu. Tempeh. Beans. Eggs. Peanut butter and other nut butters. Seafood, such as shrimp, crab, and lobster. If you choose fish, select types that are higher in omega-3 fatty acids, including salmon, herring, mussels, trout, sardines, and pollock. Make sure that all meats are cooked to food-safe temperatures. Dairy Pasteurized milk and milk alternatives. Pasteurized yogurt and pasteurized cheese. Cottage cheese. Sour cream. Beverages Water. Juices that contain 100% fruit juice or vegetable juice. Caffeine-free teas and decaffeinated coffee. Drinks that contain caffeine are okay to drink, but it is better to avoid  caffeine. Keep your total caffeine intake to less than 200 mg each day (12 oz of coffee, tea, or soda) or as directed by your health care provider. Condiments Any pasteurized condiments. Sweets and Desserts Any sweets and desserts. Fats and Oils Any fats and oils. The items listed above may not be a complete list of recommended foods or beverages. Contact your dietitian for more options. What foods are not recommended? Vegetables Unpasteurized (raw) vegetable juices. Fruits Unpasteurized (raw) fruit juices. Meats and Other Protein Sources Cured meats that have nitrates, such as bacon, salami, and hotdogs. Luncheon meats, bologna, or other deli meats (unless they are reheated until they are steaming hot). Refrigerated pate, meat spreads from a meat counter, smoked seafood that is found in the refrigerated section of a store. Raw fish, such  as sushi or sashimi. High mercury content fish, such as tilefish, shark, swordfish, and king mackerel. Raw meats, such as tuna or beef tartare. Undercooked meats and poultry. Make sure that all meats are cooked to food-safe temperatures. Dairy Unpasteurized (raw) milk and any foods that have raw milk in them. Soft cheeses, such as feta, queso blanco, queso fresco, Brie, Camembert cheeses, blue-veined cheeses, and Panela cheese (unless it is made with pasteurized milk, which must be stated on the label). Beverages Alcohol. Sugar-sweetened beverages, such as sodas, teas, or energy drinks. Condiments Homemade fermented foods and drinks, such as pickles, sauerkraut, or kombucha drinks. (Store-bought pasteurized versions of these are okay.) Other Salads that are made in the store, such as ham salad, chicken salad, egg salad, tuna salad, and seafood salad. The items listed above may not be a complete list of foods and beverages to avoid. Contact your dietitian for more information. This information is not intended to replace advice given to you by your health care  provider. Make sure you discuss any questions you have with your health care provider. Document Released: 05/06/2014 Document Revised: 12/28/2015 Document Reviewed: 01/04/2014 Elsevier Interactive Patient Education  2018 [place] of Pregnancy The first trimester of pregnancy is from week 1 until the end of week 13 (months 1 through 3). During this time, your baby will begin to develop inside you. At 6-8 weeks, the eyes and face are formed, and the heartbeat can be seen on ultrasound. At the end of 12 weeks, all the baby's organs are formed. Prenatal care is all the medical care you receive before the birth of your baby. Make sure you get good prenatal care and follow all of your doctor's instructions. Follow these instructions at home: Medicines  Take over-the-counter and prescription medicines only as told by your doctor. Some medicines are safe and some medicines are not safe during pregnancy.  Take a prenatal vitamin that contains at least 600 micrograms (mcg) of folic acid.  If you have trouble pooping (constipation), take medicine that will make your stool soft (stool softener) if your doctor approves. Eating and drinking  Eat regular, healthy meals.  Your doctor will tell you the amount of weight gain that is right for you.  Avoid raw meat and uncooked cheese.  If you feel sick to your stomach (nauseous) or throw up (vomit): ? Eat 4 or 5 small meals a day instead of 3 large meals. ? Try eating a few soda crackers. ? Drink liquids between meals instead of during meals.  To prevent constipation: ? Eat foods that are high in fiber, like fresh fruits and vegetables, whole grains, and beans. ? Drink enough fluids to keep your pee (urine) clear or pale yellow. Activity  Exercise only as told by your doctor. Stop exercising if you have cramps or pain in your lower belly (abdomen) or low back.  Do not exercise if it is too hot, too humid, or if you are in a place  of great height (high altitude).  Try to avoid standing for long periods of time. Move your legs often if you must stand in one place for a long time.  Avoid heavy lifting.  Wear low-heeled shoes. Sit and stand up straight.  You can have sex unless your doctor tells you not to. Relieving pain and discomfort  Wear a good support bra if your breasts are sore.  Take warm water baths (sitz baths) to soothe pain or discomfort caused by hemorrhoids. Use hemorrhoid  cream if your doctor says it is okay.  Rest with your legs raised if you have leg cramps or low back pain.  If you have puffy, bulging veins (varicose veins) in your legs: ? Wear support hose or compression stockings as told by your doctor. ? Raise (elevate) your feet for 15 minutes, 3-4 times a day. ? Limit salt in your food. Prenatal care  Schedule your prenatal visits by the twelfth week of pregnancy.  Write down your questions. Take them to your prenatal visits.  Keep all your prenatal visits as told by your doctor. This is important. Safety  Wear your seat belt at all times when driving.  Make a list of emergency phone numbers. The list should include numbers for family, friends, the hospital, and police and fire departments. General instructions  Ask your doctor for a referral to a local prenatal class. Begin classes no later than at the start of month 6 of your pregnancy.  Ask for help if you need counseling or if you need help with nutrition. Your doctor can give you advice or tell you where to go for help.  Do not use hot tubs, steam rooms, or saunas.  Do not douche or use tampons or scented sanitary pads.  Do not cross your legs for long periods of time.  Avoid all herbs and alcohol. Avoid drugs that are not approved by your doctor.  Do not use any tobacco products, including cigarettes, chewing tobacco, and electronic cigarettes. If you need help quitting, ask your doctor. You may get counseling or other  support to help you quit.  Avoid cat litter boxes and soil used by cats. These carry germs that can cause birth defects in the baby and can cause a loss of your baby (miscarriage) or stillbirth.  Visit your dentist. At home, brush your teeth with a soft toothbrush. Be gentle when you floss. Contact a doctor if:  You are dizzy.  You have mild cramps or pressure in your lower belly.  You have a nagging pain in your belly area.  You continue to feel sick to your stomach, you throw up, or you have watery poop (diarrhea).  You have a bad smelling fluid coming from your vagina.  You have pain when you pee (urinate).  You have increased puffiness (swelling) in your face, hands, legs, or ankles. Get help right away if:  You have a fever.  You are leaking fluid from your vagina.  You have spotting or bleeding from your vagina.  You have very bad belly cramping or pain.  You gain or lose weight rapidly.  You throw up blood. It may look like coffee grounds.  You are around people who have [place] measles, fifth disease, or chickenpox.  You have a very bad headache.  You have shortness of breath.  You have any kind of trauma, such as from a fall or a car accident. Summary  The first trimester of pregnancy is from week 1 until the end of week 13 (months 1 through 3).  To take care of yourself and your unborn baby, you will need to eat healthy meals, take medicines only if your doctor tells you to do so, and do activities that are safe for you and your baby.  Keep all follow-up visits as told by your doctor. This is important as your doctor will have to ensure that your baby is healthy and growing well. This information is not intended to replace advice given to you by  your health care provider. Make sure you discuss any questions you have with your health care provider. Document Released: 01/08/2008 Document Revised: 07/30/2016 Document Reviewed: 07/30/2016 Elsevier Interactive  Patient Education  2017 [employer].

## 2018-07-07 ENCOUNTER — Ambulatory Visit: Payer: Managed Care, Other (non HMO) | Admitting: Certified Nurse Midwife

## 2018-07-07 ENCOUNTER — Ambulatory Visit (INDEPENDENT_AMBULATORY_CARE_PROVIDER_SITE_OTHER): Payer: Managed Care, Other (non HMO)

## 2018-07-07 ENCOUNTER — Other Ambulatory Visit: Payer: Self-pay | Admitting: Obstetrics and Gynecology

## 2018-07-07 VITALS — BP 122/71 | HR 83 | Ht 66.0 in | Wt 245.7 lb

## 2018-07-07 DIAGNOSIS — O3481 Maternal care for other abnormalities of pelvic organs, first trimester: Secondary | ICD-10-CM | POA: Diagnosis not present

## 2018-07-07 DIAGNOSIS — Z3A01 Less than 8 weeks gestation of pregnancy: Secondary | ICD-10-CM

## 2018-07-07 DIAGNOSIS — Z3201 Encounter for pregnancy test, result positive: Secondary | ICD-10-CM

## 2018-07-07 DIAGNOSIS — N926 Irregular menstruation, unspecified: Secondary | ICD-10-CM

## 2018-07-07 DIAGNOSIS — N8311 Corpus luteum cyst of right ovary: Secondary | ICD-10-CM | POA: Diagnosis not present

## 2018-07-07 NOTE — Progress Notes (Signed)
I have reviewed the record and concur with patient management and plan of care.    Gunnar BullaJenkins Michelle Makhi Muzquiz, CNM [facility], [facility] 07/07/18 3:48 PM

## 2018-07-07 NOTE — Progress Notes (Signed)
[employer] Oliger presents for [employer] visit. Pregnancy confirmation done here at [facility].    G-1 .  P-    . Pregnancy education material explained and given. There is cats in the home, husband has been changing litter box. NOB labs ordered. (TSH/HbgA1c due to Increased BMI),. HIV labs and Drug screen were explained optional and she did not decline. Drug screen ordered PNV encouraged. Genetic screening options discussed. Genetic testing:/Unsure.  Pt may discuss with provider. Pt was given [employer] policy and FMLA paper was signed.  Pt has appt already set up to see JML.  Pt voiced understanding

## 2018-07-08 LAB — MICROSCOPIC EXAMINATION
Casts: NONE SEEN /lpf
Epithelial Cells (non renal): >10 /hpf — AB (ref 0–10)
RBC MICROSCOPIC, UA: NONE SEEN /HPF (ref 0–2)

## 2018-07-08 LAB — URINALYSIS, ROUTINE W REFLEX MICROSCOPIC
Bilirubin, UA: NEGATIVE
Glucose, UA: NEGATIVE
Ketones, UA: NEGATIVE
Nitrite, UA: NEGATIVE
Protein, UA: NEGATIVE
RBC, UA: NEGATIVE
Specific Gravity, UA: 1.02 (ref 1.005–1.030)
Urobilinogen, Ur: 0.2 mg/dL (ref 0.2–1.0)
pH, UA: 5.5 (ref 5.0–7.5)

## 2018-07-08 LAB — MONITOR DRUG PROFILE 14(MW)
Amphetamine Scrn, Ur: NEGATIVE ng/mL
BARBITURATE SCREEN URINE: NEGATIVE ng/mL
BENZODIAZEPINE SCREEN, URINE: NEGATIVE ng/mL
Buprenorphine, Urine: NEGATIVE ng/mL
CANNABINOIDS UR QL SCN: NEGATIVE ng/mL
Cocaine (Metab) Scrn, Ur: NEGATIVE ng/mL
Creatinine(Crt), U: 167.6 mg/dL (ref 20.0–300.0)
Fentanyl, Urine: NEGATIVE pg/mL
Meperidine Screen, Urine: NEGATIVE ng/mL
Methadone Screen, Urine: NEGATIVE ng/mL
OXYCODONE+OXYMORPHONE UR QL SCN: NEGATIVE ng/mL
Opiate Scrn, Ur: NEGATIVE ng/mL
Ph of Urine: 5.5 (ref 4.5–8.9)
Phencyclidine Qn, Ur: NEGATIVE ng/mL
Propoxyphene Scrn, Ur: NEGATIVE ng/mL
SPECIFIC GRAVITY: 1.017
Tramadol Screen, Urine: NEGATIVE ng/mL

## 2018-07-08 LAB — NICOTINE SCREEN, URINE: Cotinine Ql Scrn, Ur: NEGATIVE ng/mL

## 2018-07-08 LAB — GC/CHLAMYDIA PROBE AMP
Chlamydia trachomatis, NAA: NEGATIVE
Neisseria gonorrhoeae by PCR: NEGATIVE

## 2018-07-09 LAB — TOXOPLASMA ANTIBODIES- IGG AND  IGM
Toxoplasma Antibody- IgM: <3 AU/mL (ref 0.0–7.9)
Toxoplasma IgG Ratio: <3 IU/mL (ref 0.0–7.1)

## 2018-07-09 LAB — ANTIBODY SCREEN: Antibody Screen: NEGATIVE

## 2018-07-09 LAB — HIV ANTIBODY (ROUTINE TESTING W REFLEX): HIV Screen 4th Generation wRfx: NONREACTIVE

## 2018-07-09 LAB — VARICELLA ZOSTER ANTIBODY, IGG: Varicella zoster IgG: 1120 index (ref >165)

## 2018-07-09 LAB — HEMOGLOBIN A1C
Est. average glucose Bld gHb Est-mCnc: 103 mg/dL
HEMOGLOBIN A1C: 5.2 % (ref 4.8–5.6)

## 2018-07-09 LAB — URINE CULTURE

## 2018-07-09 LAB — RPR: RPR Ser Ql: NONREACTIVE

## 2018-07-09 LAB — RUBELLA SCREEN: Rubella Antibodies, IGG: 1.51 index (ref >0.99)

## 2018-07-09 LAB — TSH: TSH: 4.04 u[IU]/mL (ref 0.450–4.500)

## 2018-07-09 LAB — HGB SOLU + RFLX FRAC: Sickle Solubility Test - HGBRFX: NEGATIVE

## 2018-07-09 LAB — ABO AND RH: RH TYPE: POSITIVE

## 2018-07-09 LAB — HEPATITIS B SURFACE ANTIGEN: HEP B S AG: NEGATIVE

## 2018-08-05 ENCOUNTER — Encounter: Payer: Self-pay | Admitting: Certified Nurse Midwife

## 2018-08-05 NOTE — L&D Delivery Note (Signed)
Delivery Note  In room to see patient, RN reports effective maternal pushing efforts.   Spontaneous vaginal birth of liveborn female infant in right occiput anterior at 69. Nuchal cord x one (1) reduced after birth. Infant immediately to maternal abdomen. Delayed cord clamping. Skin to skin. Three (3) vessel cord and cord blood collected. APGARs: 8, 9. Weight: pending. Receiving nurse present at bedside for birth.   IM pitocin given in right thigh. Spontaneous delivery of intact placenta at 0425. Upon assessment of perineum, patient was found to have 3A laceration. Dr Marcelline Mates contact to assist with repair at [phone].   Dr. Marcelline Mates in room at [facility], see note for further details. 3A laceration repaired to second degree which was repaired with 3-0 vicryl rapide with local and epidural anesthesia. Lacerations hemostatic and well approximated. Uterus firm. Lochia small. Vault check completed. Counts correct x 2. QBL: 415 ml.   Initiate routine postpartum care and orders. Mom to postpartum.  Baby to Couplet care / Skin to Skin.  FOB present at bedside and overjoyed with the birth of "[place]".    Diona Fanti, CNM [facility], [facility] 02/24/2019, 5:17 AM

## 2018-08-10 ENCOUNTER — Encounter: Payer: Self-pay | Admitting: Certified Nurse Midwife

## 2018-08-10 ENCOUNTER — Ambulatory Visit (INDEPENDENT_AMBULATORY_CARE_PROVIDER_SITE_OTHER): Payer: Managed Care, Other (non HMO) | Admitting: Certified Nurse Midwife

## 2018-08-10 VITALS — BP 111/78 | HR 81 | Wt 245.2 lb

## 2018-08-10 DIAGNOSIS — Z130 Encounter for screening for diseases of the blood and blood-forming organs and certain disorders involving the immune mechanism: Secondary | ICD-10-CM

## 2018-08-10 DIAGNOSIS — Z3A12 12 weeks gestation of pregnancy: Secondary | ICD-10-CM

## 2018-08-10 DIAGNOSIS — O99210 Obesity complicating pregnancy, unspecified trimester: Secondary | ICD-10-CM

## 2018-08-10 DIAGNOSIS — O99211 Obesity complicating pregnancy, first trimester: Secondary | ICD-10-CM

## 2018-08-10 DIAGNOSIS — Z3401 Encounter for supervision of normal first pregnancy, first trimester: Secondary | ICD-10-CM

## 2018-08-10 LAB — POCT URINALYSIS DIPSTICK OB
Bilirubin, UA: NEGATIVE
Blood, UA: NEGATIVE
Glucose, UA: NEGATIVE
Ketones, UA: NEGATIVE
Leukocytes, UA: NEGATIVE
Nitrite, UA: NEGATIVE
POC,PROTEIN,UA: NEGATIVE
Spec Grav, UA: 1.02 (ref 1.010–1.025)
Urobilinogen, UA: 0.2 E.U./dL
pH, UA: 6 (ref 5.0–8.0)

## 2018-08-10 MED ORDER — CEFDINIR 300 MG PO CAPS: 300.0000 mg | ORAL_CAPSULE | Freq: Two times a day (BID) | ORAL | 0 refills | Status: AC

## 2018-08-10 NOTE — Progress Notes (Signed)
Patient here for NOB PE.  Patient c/o nasal congestion, weakness, possible temp, fatigue and feeling light headed x2 weeks.  Patient taking Claritin and Flonase as needed with no relief.  Patient reports drinking about 3oz of wine 12/31 and felt very sick that night, had vomiting and diarrhea.  Temp in office today 98.0

## 2018-08-10 NOTE — Progress Notes (Signed)
NEW OB HISTORY AND PHYSICAL  SUBJECTIVE:       Shelia Daniels is a 33 y.o. G61P0000 female, Patient's last menstrual period was 05/18/2018 (exact date)., Estimated Date of Delivery: 02/22/19, [redacted]w[redacted]d presents today for establishment of Prenatal Care.  Reports nasal congestion, fatigue, and intermittent lightheadedness. Minimal relief with home treatment measures.   Denies difficulty breathing or respiratory distress, chest pain, abdominal pain, vaginal bleeding, dysuria, and leg pain or swelling.   Desires genetic screening.    Gynecologic History  Patient's last menstrual period was 05/18/2018 (exact date).   Contraception: none  Last Pap: 12/2015. Results were: Negative/Negative  Obstetric History  OB History  Gravida Para Term Preterm AB Living  1 0 0 0 0 0  SAB TAB Ectopic Multiple Live Births  0 0 0 0 0    # Outcome Date GA Lbr Len/2nd Weight Sex Delivery Anes PTL Lv  1 Current             Past Medical History:  Diagnosis Date  . Family history of breast cancer   . Genetic testing 12/2015   My Risk/BRCA neg  . Increased risk of breast cancer 12/2015   IBIS=21.7%    Past Surgical History:  Procedure Laterality Date  . INTRAUTERINE DEVICE (IUD) INSERTION  05/2014   Mirena    Current Outpatient Medications on File Prior to Visit  Medication Sig Dispense Refill  . fluticasone (FLONASE) 50 MCG/ACT nasal spray Place 2 sprays into both nostrils as needed for allergies or rhinitis.    .Marland Kitchenloratadine (CLARITIN) 10 MG tablet Take 10 mg by mouth daily.    . Prenatal MV-Min-Fe Fum-FA-DHA (PRENATAL+DHA PO)     . Vitamin D, Ergocalciferol, (DRISDOL) 50000 units CAPS capsule Take 1 capsule (50,000 Units total) by mouth every 7 (seven) days. 90 capsule 6   No current facility-administered medications on file prior to visit.     Allergies  Allergen Reactions  . Pollen Extract Other (See Comments)    Sneezing, watery eyes, seasonal    Social History   Socioeconomic  History  . Marital status: Married    Spouse name: Not on file  . Number of children: Not on file  . Years of education: Not on file  . Highest education level: Not on file  Occupational History  . Not on file  Social Needs  . Financial resource strain: Not on file  . Food insecurity:    Worry: Not on file    Inability: Not on file  . Transportation needs:    Medical: Not on file    Non-medical: Not on file  Tobacco Use  . Smoking status: Never Smoker  . Smokeless tobacco: Never Used  Substance and Sexual Activity  . Alcohol use: Not Currently    Alcohol/week: 2.0 standard drinks    Types: 2 Standard drinks or equivalent per week  . Drug use: No  . Sexual activity: Yes    Birth control/protection: None  Lifestyle  . Physical activity:    Days per week: Not on file    Minutes per session: Not on file  . Stress: Not on file  Relationships  . Social connections:    Talks on phone: Not on file    Gets together: Not on file    Attends religious service: Not on file    Active member of club or organization: Not on file    Attends meetings of clubs or organizations: Not on file    Relationship status: Not  on file  . Intimate partner violence:    Fear of current or ex partner: Not on file    Emotionally abused: Not on file    Physically abused: Not on file    Forced sexual activity: Not on file  Other Topics Concern  . Not on file  Social History Narrative  . Not on file    Family History  Problem Relation Age of Onset  . Breast cancer Paternal Grandmother 70  . Pancreatic cancer Paternal Grandmother   . Diabetes Paternal Grandmother   . Diabetes Mother   . Hyperlipidemia Mother   . Hypertension Mother   . Diabetes Father   . Heart attack Father   . Hyperlipidemia Maternal Uncle   . Hypertension Maternal Uncle   . Hyperlipidemia Maternal Grandfather   . Hypertension Maternal Grandfather   . Lymphoma Paternal Grandfather   . Ovarian cancer Neg Hx   . Colon  cancer Neg Hx     The following portions of the patient's history were reviewed and updated as appropriate: allergies, current medications, past OB history, past medical history, past surgical history, past family history, past social history, and problem list.  Review of Systems:   ROS negative except as noted above. Information obtained from patient.   OBJECTIVE:  BP 111/78   Pulse 81   Wt 245 lb 3.2 oz (111.2 kg)   LMP 05/18/2018 (Exact Date)   BMI 39.58 kg/m   Initial Physical Exam (New OB)  GENERAL APPEARANCE: alert, well appearing, in no apparent distress  HEAD: normocephalic, atraumatic; bilateral bulging tympanic membranes  MOUTH: mucous membranes moist, pharynx normal without lesions  THYROID: no thyromegaly or masses present  BREASTS: no masses noted, no significant tenderness, no palpable axillary nodes, no skin changes  LUNGS: clear to auscultation, no wheezes, rales or rhonchi, symmetric air entry  HEART: regular rate and rhythm, no murmurs  ABDOMEN: soft, nontender, nondistended, no abnormal masses, no epigastric pain, obese and FHT present; verified by bedside ultrasound  EXTREMITIES: no redness or tenderness in the calves or thighs, no edema  SKIN: normal coloration and turgor, no rashes  LYMPH NODES: no adenopathy palpable  NEUROLOGIC: alert, oriented, normal speech, no focal findings or movement disorder noted  PELVIC EXAM: deferred, no complaints  ASSESSMENT: Normal pregnancy Obesity in pregnancy Desires genetic screening Rh positive Acute otits media   PLAN: Prenatal care Labs: CBC and Panorama today New OB counseling: The patient has been given an overview regarding routine prenatal care. Recommendations regarding diet, weight gain, and exercise in pregnancy were given. Prenatal testing, optional genetic testing, and ultrasound use in pregnancy were reviewed.  Benefits of Breast Feeding were discussed. The patient is encouraged to  consider nursing her baby post partum. See orders   Diona Fanti, CNM [facility], [facility] 08/10/18 10:48 AM

## 2018-08-10 NOTE — Patient Instructions (Signed)
WHAT OB PATIENTS CAN EXPECT   Confirmation of pregnancy and ultrasound ordered if medically indicated-[redacted] weeks gestation  New OB (NOB) intake with nurse and New OB (NOB) labs- [redacted] weeks gestation  New OB (NOB) physical examination with provider- 11/[redacted] weeks gestation  Flu vaccine-[redacted] weeks gestation  Anatomy scan-[redacted] weeks gestation  Glucose tolerance test, blood work to test for anemia, T-dap vaccine-[redacted] weeks gestation  Vaginal swabs/cultures-STD/Group B strep-[redacted] weeks gestation  Appointments every 4 weeks until 28 weeks  Every 2 weeks from 28 weeks until 36 weeks  Weekly visits from 36 weeks until delivery  Common Medications Safe in Pregnancy  Acne:      Constipation:  Benzoyl Peroxide     Colace  Clindamycin      Dulcolax Suppository  Topica Erythromycin     Fibercon  Salicylic Acid      Metamucil         Miralax AVOID:        Senakot   Accutane    Cough:  Retin-A       Cough Drops  Tetracycline      Phenergan w/ Codeine if Rx  Minocycline      Robitussin (Plain & DM)  Antibiotics:     Crabs/Lice:  Ceclor       RID  Cephalosporins    AVOID:  E-Mycins      Kwell  Keflex  Macrobid/Macrodantin   Diarrhea:  Penicillin      Kao-Pectate  Zithromax      Imodium AD         PUSH FLUIDS AVOID:       Cipro     Fever:  Tetracycline      Tylenol (Regular or Extra  Minocycline       Strength)  Levaquin      Extra Strength-Do not          Exceed 8 tabs/24 hrs Caffeine:        <253m/day (equiv. To 1 cup of coffee or  approx. 3 12 oz sodas)         Gas: Cold/Hayfever:       Gas-X  Benadryl      Mylicon  Claritin       Phazyme  **Claritin-D        Chlor-Trimeton    Headaches:  Dimetapp      ASA-Free Excedrin  Drixoral-Non-Drowsy     Cold Compress  Mucinex (Guaifenasin)     Tylenol (Regular or Extra  Sudafed/Sudafed-12 Hour     Strength)  **Sudafed PE Pseudoephedrine   Tylenol Cold & Sinus     Vicks Vapor Rub  Zyrtec  **AVOID if Problems With Blood  Pressure         Heartburn: Avoid lying down for at least 1 hour after meals  Aciphex      Maalox     Rash:  Milk of Magnesia     Benadryl    Mylanta       1% Hydrocortisone Cream  Pepcid  Pepcid Complete   Sleep Aids:  Prevacid      Ambien   Prilosec       Benadryl  Rolaids       Chamomile Tea  Tums (Limit 4/day)     Unisom  Zantac       Tylenol PM         Warm milk-add vanilla or  Hemorrhoids:       Sugar for taste  Anusol/Anusol H.C.  (RX: Analapram 2.5%)  Sugar Substitutes:  Hydrocortisone OTC     Ok in moderation  Preparation H      Tucks        Vaseline lotion applied to tissue with wiping    Herpes:     Throat:  Acyclovir      Oragel  Famvir  Valtrex     Vaccines:         Flu Shot Leg Cramps:       *Gardasil  Benadryl      Hepatitis A         Hepatitis B Nasal Spray:       Pneumovax  Saline Nasal Spray     Polio Booster         Tetanus Nausea:       Tuberculosis test or PPD  Vitamin B6 25 mg TID   AVOID:    Dramamine      *Gardasil  Emetrol       Live Poliovirus  Ginger Root 250 mg QID    MMR (measles, mumps &  High Complex Carbs @ Bedtime    rebella)  Sea Bands-Accupressure    Varicella (Chickenpox)  Unisom 1/2 tab TID     *No known complications           If received before Pain:         Known pregnancy;   Darvocet       Resume series after  Lortab        Delivery  Percocet    Yeast:   Tramadol      Femstat  Tylenol 3      Gyne-lotrimin  Ultram       Monistat  Vicodin           MISC:         All Sunscreens           Hair Coloring/highlights          Insect Repellant's          (Including DEET)         Mystic Tans Eating Plan for Pregnant Women While you are pregnant, your body requires additional nutrition to help support your growing baby. You also have a higher need for some vitamins and minerals, such as folic acid, calcium, iron, and vitamin D. Eating a healthy, well-balanced diet is very important for your health and your baby's health.  Your need for extra calories varies for the three 44-monthsegments of your pregnancy (trimesters). For most women, it is recommended to consume:  150 extra calories a day during the first trimester.  300 extra calories a day during the second trimester.  300 extra calories a day during the third trimester. What are tips for following this plan?   Do not try to lose weight or go on a diet during pregnancy.  Limit your overall intake of foods that have "empty calories." These are foods that have little nutritional value, such as sweets, desserts, candies, and sugar-sweetened beverages.  Eat a variety of foods (especially fruits and vegetables) to get a full range of vitamins and minerals.  Take a prenatal vitamin to help meet your additional vitamin and mineral needs during pregnancy, specifically for folic acid, iron, calcium, and vitamin D.  Remember to stay active. Ask your health care provider what types of exercise and activities are safe for you.  Practice good food safety and cleanliness. Wash your hands before you eat and after you prepare raw meat. Wash all fruits  and vegetables well before peeling or eating. Taking these actions can help to prevent food-borne illnesses that can be very dangerous to your baby, such as listeriosis. Ask your health care provider for more information about listeriosis. What does 150 extra calories look like? Healthy options that provide 150 extra calories each day could be any of the following:  6-8 oz (170-230 g) of plain low-fat yogurt with  cup of berries.  1 apple with 2 teaspoons (11 g) of peanut butter.  Cut-up vegetables with  cup (60 g) of hummus.  8 oz (230 mL) or 1 cup of low-fat chocolate milk.  1 stick of string cheese with 1 medium orange.  1 peanut butter and jelly sandwich that is made with one slice of whole-wheat bread and 1 tsp (5 g) of peanut butter. For 300 extra calories, you could eat two of those healthy options each  day. What is a healthy amount of weight to gain? The right amount of weight gain for you is based on your BMI before you became pregnant. If your BMI:  Was less than 18 (underweight), you should gain 28-40 lb (13-18 kg).  Was 18-24.9 (normal), you should gain 25-35 lb (11-16 kg).  Was 25-29.9 (overweight), you should gain 15-25 lb (7-11 kg).  Was 30 or greater (obese), you should gain 11-20 lb (5-9 kg). What if I am having twins or multiples? Generally, if you are carrying twins or multiples:  You may need to eat 300-600 extra calories a day.  The recommended range for total weight gain is 25-54 lb (11-25 kg), depending on your BMI before pregnancy.  Talk with your health care provider to find out about nutritional needs, weight gain, and exercise that is right for you. What foods can I eat?  Grains All grains. Choose whole grains, such as whole-wheat bread, oatmeal, or brown rice. Vegetables All vegetables. Eat a variety of colors and types of vegetables. Remember to wash your vegetables well before peeling or eating. Fruits All fruits. Eat a variety of colors and types of fruit. Remember to wash your fruits well before peeling or eating. Meats and other protein foods Lean meats, including chicken, [place], fish, and lean cuts of beef, veal, or pork. If you eat fish or seafood, choose options that are higher in omega-3 fatty acids and lower in mercury, such as salmon, herring, mussels, trout, sardines, pollock, shrimp, crab, and lobster. Tofu. Tempeh. Beans. Eggs. Peanut butter and other nut butters. Make sure that all meats, poultry, and eggs are cooked to food-safe temperatures or "well-done." Two or more servings of fish are recommended each week in order to get the most benefits from omega-3 fatty acids that are found in seafood. Choose fish that are lower in mercury. You can find more information online:  [employer]Gaming.ch Dairy Pasteurized milk and milk alternatives (such as almond  milk). Pasteurized yogurt and pasteurized cheese. Cottage cheese. Sour cream. Beverages Water. Juices that contain 100% fruit juice or vegetable juice. Caffeine-free teas and decaffeinated coffee. Drinks that contain caffeine are okay to drink, but it is better to avoid caffeine. Keep your total caffeine intake to less than 200 mg each day (which is 12 oz or 355 mL of coffee, tea, or soda) or the limit as told by your health care provider. Fats and oils Fats and oils are okay to include in moderation. Sweets and desserts Sweets and desserts are okay to include in moderation. Seasoning and other foods All pasteurized condiments. The items listed above  may not be a complete list of recommended foods and beverages. Contact your dietitian for more options. What foods are not recommended? Vegetables Raw (unpasteurized) vegetable juices. Fruits Unpasteurized fruit juices. Meats and other protein foods Lunch meats, bologna, hot dogs, or other deli meats. (If you must eat those meats, reheat them until they are steaming hot.) Refrigerated pat, meat spreads from a meat counter, smoked seafood that is found in the refrigerated section of a store. Raw or undercooked meats, poultry, and eggs. Raw fish, such as sushi or sashimi. Fish that have high mercury content, such as tilefish, shark, swordfish, and king mackerel. To learn more about mercury in fish, talk with your health care provider or look for online resources, such as:  [place]Gaming.ch Dairy Raw (unpasteurized) milk and any foods that have raw milk in them. Soft cheeses, such as feta, queso blanco, queso fresco, Brie, Camembert cheeses, blue-veined cheeses, and Panela cheese (unless it is made with pasteurized milk, which must be stated on the label). Beverages Alcohol. Sugar-sweetened beverages, such as sodas, teas, or energy drinks. Seasoning and other foods Homemade fermented foods and drinks, such as pickles, sauerkraut, or kombucha drinks.  (Store-bought pasteurized versions of these are okay.) Salads that are made in a store or deli, such as ham salad, chicken salad, egg salad, tuna salad, and seafood salad. The items listed above may not be a complete list of foods and beverages to avoid. Contact your dietitian for more information. Where to find more information To calculate the number of calories you need based on your height, weight, and activity level, you can use an online calculator such as:  MobileTransition.ch To calculate how much weight you should gain during pregnancy, you can use an online pregnancy weight gain calculator such as:  StreamingFood.com.cy Summary  While you are pregnant, your body requires additional nutrition to help support your growing baby.  Eat a variety of foods, especially fruits and vegetables to get a full range of vitamins and minerals.  Practice good food safety and cleanliness. Wash your hands before you eat and after you prepare raw meat. Wash all fruits and vegetables well before peeling or eating. Taking these actions can help to prevent food-borne illnesses, such as listeriosis, that can be very dangerous to your baby.  Do not eat raw meat or fish. Do not eat fish that have high mercury content, such as tilefish, shark, swordfish, and king mackerel. Do not eat unpasteurized (raw) dairy.  Take a prenatal vitamin to help meet your additional vitamin and mineral needs during pregnancy, specifically for folic acid, iron, calcium, and vitamin D. This information is not intended to replace advice given to you by your health care provider. Make sure you discuss any questions you have with your health care provider. Document Released: 05/06/2014 Document Revised: 04/18/2017 Document Reviewed: 04/18/2017 Elsevier Interactive Patient Education  2019 [place] of Pregnancy  The second trimester is from week 14 through week 27  (month 4 through 6). This is often the time in pregnancy that you feel your best. Often times, morning sickness has lessened or quit. You may have more energy, and you may get hungry more often. Your unborn baby is growing rapidly. At the end of the sixth month, he or she is about 9 inches long and weighs about 1 pounds. You will likely feel the baby move between 18 and 20 weeks of pregnancy. Follow these instructions at home: Medicines  Take over-the-counter and prescription medicines only as told by  your doctor. Some medicines are safe and some medicines are not safe during pregnancy.  Take a prenatal vitamin that contains at least 600 micrograms (mcg) of folic acid.  If you have trouble pooping (constipation), take medicine that will make your stool soft (stool softener) if your doctor approves. Eating and drinking   Eat regular, healthy meals.  Avoid raw meat and uncooked cheese.  If you get low calcium from the food you eat, talk to your doctor about taking a daily calcium supplement.  Avoid foods that are high in fat and sugars, such as fried and sweet foods.  If you feel sick to your stomach (nauseous) or throw up (vomit): ? Eat 4 or 5 small meals a day instead of 3 large meals. ? Try eating a few soda crackers. ? Drink liquids between meals instead of during meals.  To prevent constipation: ? Eat foods that are high in fiber, like fresh fruits and vegetables, whole grains, and beans. ? Drink enough fluids to keep your pee (urine) clear or pale yellow. Activity  Exercise only as told by your doctor. Stop exercising if you start to have cramps.  Do not exercise if it is too hot, too humid, or if you are in a place of great height (high altitude).  Avoid heavy lifting.  Wear low-heeled shoes. Sit and stand up straight.  You can continue to have sex unless your doctor tells you not to. Relieving pain and discomfort  Wear a good support bra if your breasts are  tender.  Take warm water baths (sitz baths) to soothe pain or discomfort caused by hemorrhoids. Use hemorrhoid cream if your doctor approves.  Rest with your legs raised if you have leg cramps or low back pain.  If you develop puffy, bulging veins (varicose veins) in your legs: ? Wear support hose or compression stockings as told by your doctor. ? Raise (elevate) your feet for 15 minutes, 3-4 times a day. ? Limit salt in your food. Prenatal care  Write down your questions. Take them to your prenatal visits.  Keep all your prenatal visits as told by your doctor. This is important. Safety  Wear your seat belt when driving.  Make a list of emergency phone numbers, including numbers for family, friends, the hospital, and police and fire departments. General instructions  Ask your doctor about the right foods to eat or for help finding a counselor, if you need these services.  Ask your doctor about local prenatal classes. Begin classes before month 6 of your pregnancy.  Do not use hot tubs, steam rooms, or saunas.  Do not douche or use tampons or scented sanitary pads.  Do not cross your legs for long periods of time.  Visit your dentist if you have not done so. Use a soft toothbrush to brush your teeth. Floss gently.  Avoid all smoking, herbs, and alcohol. Avoid drugs that are not approved by your doctor.  Do not use any products that contain nicotine or tobacco, such as cigarettes and e-cigarettes. If you need help quitting, ask your doctor.  Avoid cat litter boxes and soil used by cats. These carry germs that can cause birth defects in the baby and can cause a loss of your baby (miscarriage) or stillbirth. Contact a doctor if:  You have mild cramps or pressure in your lower belly.  You have pain when you pee (urinate).  You have bad smelling fluid coming from your vagina.  You continue to feel sick to  your stomach (nauseous), throw up (vomit), or have watery poop  (diarrhea).  You have a nagging pain in your belly area.  You feel dizzy. Get help right away if:  You have a fever.  You are leaking fluid from your vagina.  You have spotting or bleeding from your vagina.  You have severe belly cramping or pain.  You lose or gain weight rapidly.  You have trouble catching your breath and have chest pain.  You notice sudden or extreme puffiness (swelling) of your face, hands, ankles, feet, or legs.  You have not felt the baby move in over an hour.  You have severe headaches that do not go away when you take medicine.  You have trouble seeing. Summary  The second trimester is from week 14 through week 27 (months 4 through 6). This is often the time in pregnancy that you feel your best.  To take care of yourself and your unborn baby, you will need to eat healthy meals, take medicines only if your doctor tells you to do so, and do activities that are safe for you and your baby.  Call your doctor if you get sick or if you notice anything unusual about your pregnancy. Also, call your doctor if you need help with the right food to eat, or if you want to know what activities are safe for you. This information is not intended to replace advice given to you by your health care provider. Make sure you discuss any questions you have with your health care provider. Document Released: 10/16/2009 Document Revised: 08/27/2016 Document Reviewed: 08/27/2016 Elsevier Interactive Patient Education  2019 [employer].

## 2018-08-11 LAB — CBC
Hematocrit: 37 % (ref 34.0–46.6)
Hemoglobin: 12.6 g/dL (ref 11.1–15.9)
MCH: 28.3 pg (ref 26.6–33.0)
MCHC: 34.1 g/dL (ref 31.5–35.7)
MCV: 83 fL (ref 79–97)
PLATELETS: 288 10*3/uL (ref 150–450)
RBC: 4.45 x10E6/uL (ref 3.77–5.28)
RDW: 13.7 % (ref 11.7–15.4)
WBC: 7.1 10*3/uL (ref 3.4–10.8)

## 2018-08-25 IMAGING — US US BREAST*R* LIMITED INC AXILLA
1 series · 5 of 5 positions shown · non-contrast
Comparison: Previous exam(s).

CLINICAL DATA: Six-month follow-up right breast mass

EXAM:
ULTRASOUND OF THE RIGHT BREAST

[Series 1: us breast*right* limited inc axilla · 0.05mm/px · 5 of 5 slices shown]
[im 1/5]
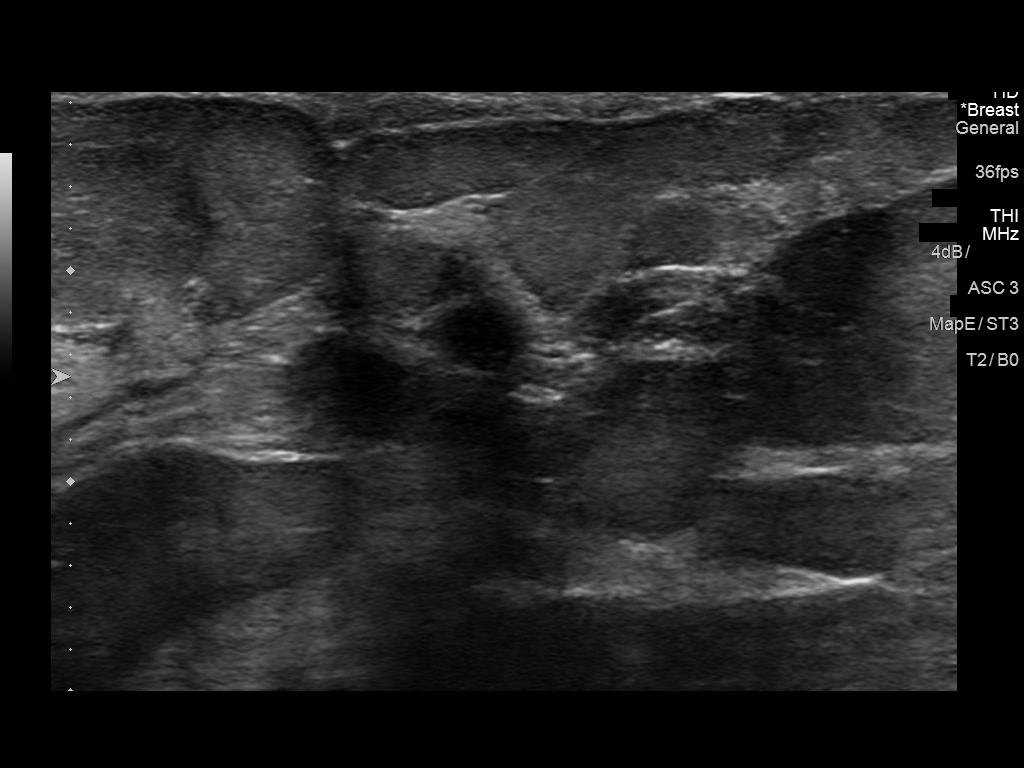
[im 2/5]
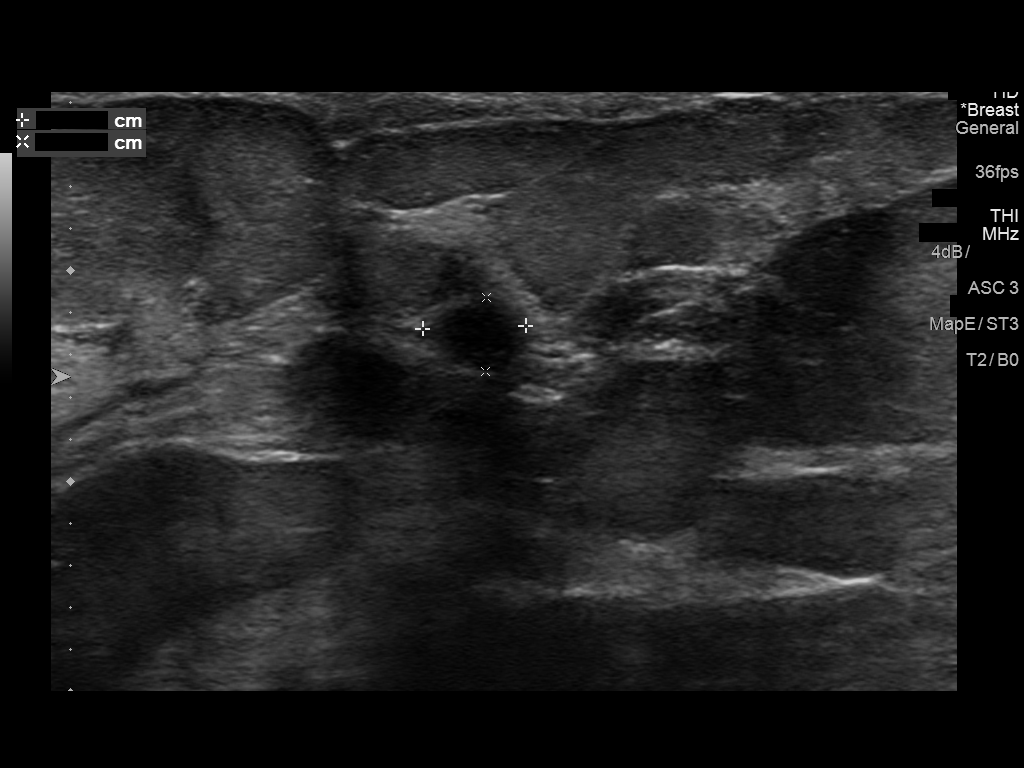
[im 3/5]
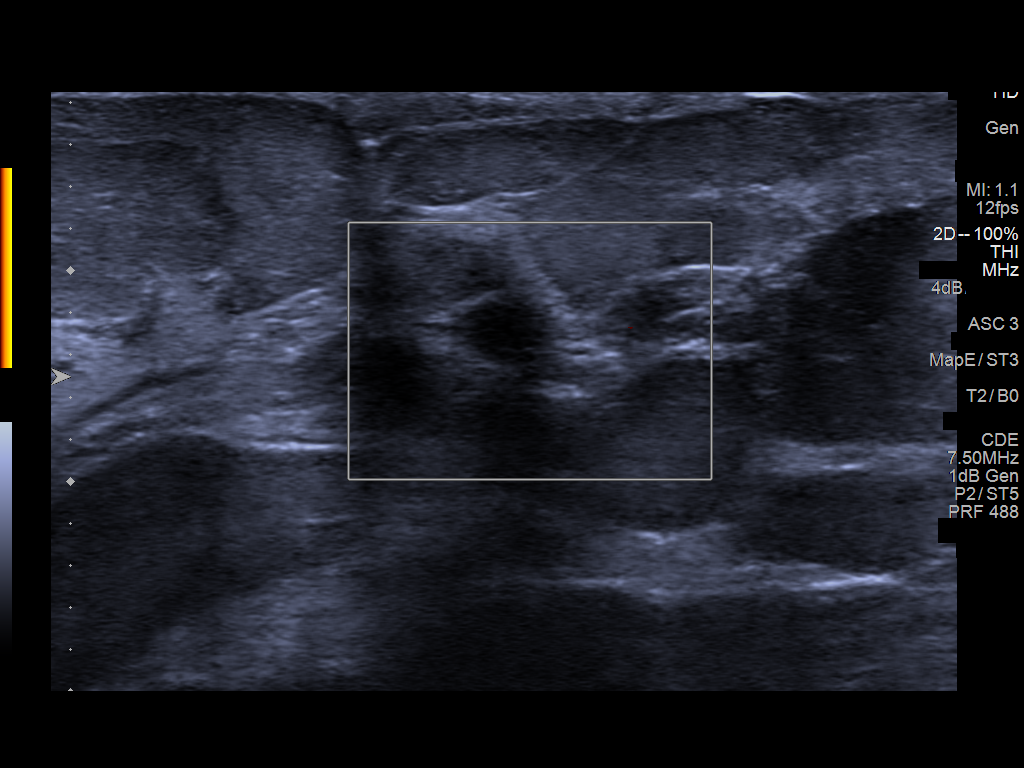
[im 4/5]
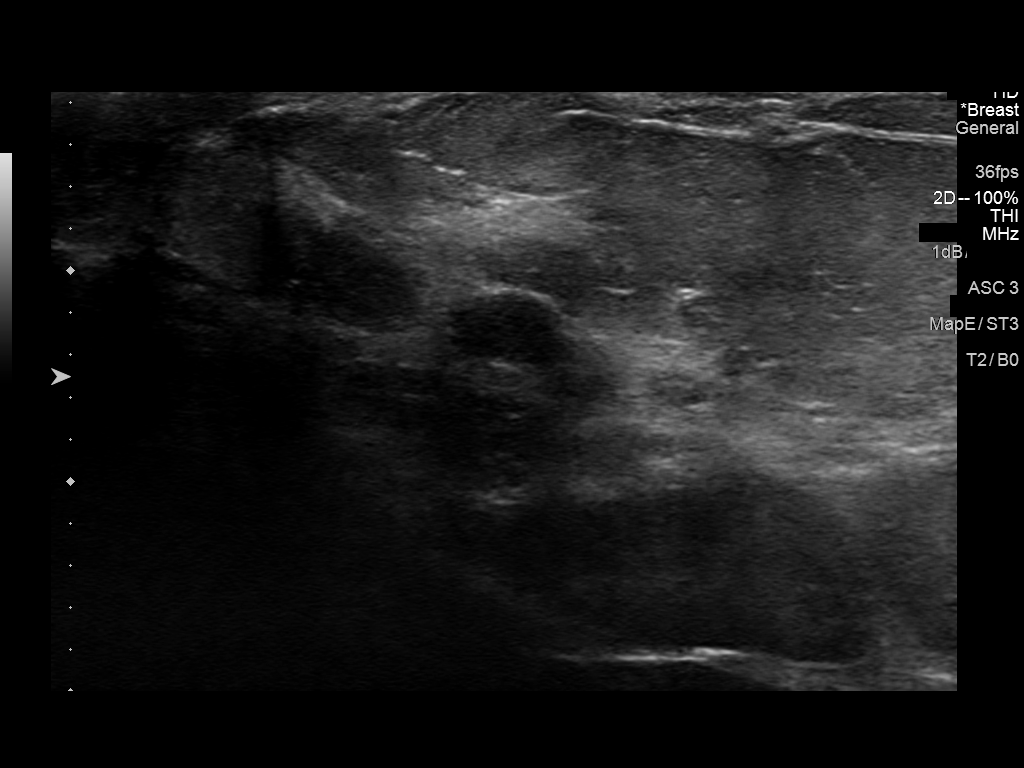
[im 5/5]
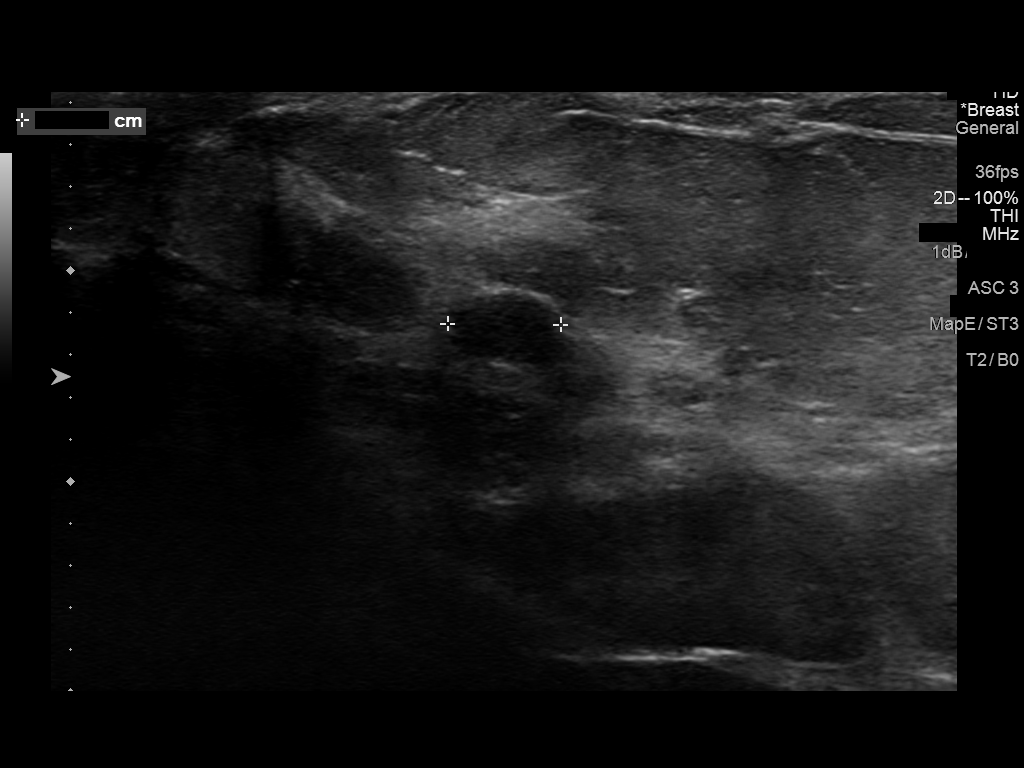

[5 of 5 positions shown; findings below may reference images not displayed]

FINDINGS: Targeted ultrasound is performed, showing a 0.5 x 0.35 x 0.54 cm
oval hypoechoic mass at the right breast retroareolar 1 o'clock
unchanged compared prior ultrasound.
IMPRESSION: Probable benign findings.

RECOMMENDATION:
Six-month follow-up ultrasound right breast.

I have discussed the findings and recommendations with the patient.
Results were also provided in writing at the conclusion of the
visit. If applicable, a reminder letter will be sent to the patient
regarding the next appointment.

BI-RADS CATEGORY  3: Probably benign.

## 2018-08-26 ENCOUNTER — Encounter: Payer: Self-pay | Admitting: Certified Nurse Midwife

## 2018-09-08 ENCOUNTER — Encounter: Payer: Self-pay | Admitting: Certified Nurse Midwife

## 2018-09-08 ENCOUNTER — Ambulatory Visit (INDEPENDENT_AMBULATORY_CARE_PROVIDER_SITE_OTHER): Payer: Managed Care, Other (non HMO) | Admitting: Certified Nurse Midwife

## 2018-09-08 ENCOUNTER — Other Ambulatory Visit: Payer: Managed Care, Other (non HMO)

## 2018-09-08 VITALS — BP 95/68 | HR 86 | Wt 249.1 lb

## 2018-09-08 DIAGNOSIS — Z131 Encounter for screening for diabetes mellitus: Secondary | ICD-10-CM

## 2018-09-08 DIAGNOSIS — Z3492 Encounter for supervision of normal pregnancy, unspecified, second trimester: Secondary | ICD-10-CM

## 2018-09-08 LAB — POCT URINALYSIS DIPSTICK OB
Bilirubin, UA: NEGATIVE
Blood, UA: NEGATIVE
Glucose, UA: NEGATIVE
Ketones, UA: NEGATIVE
Leukocytes, UA: NEGATIVE
Nitrite, UA: NEGATIVE
SPEC GRAV UA: 1.015 (ref 1.010–1.025)
Urobilinogen, UA: 0.2 E.U./dL
pH, UA: 7.5 (ref 5.0–8.0)

## 2018-09-08 NOTE — Patient Instructions (Signed)

## 2018-09-08 NOTE — Progress Notes (Signed)
ROB doing well. Pt state she is not feeling movement yet. She continues to have sinus pain. State she has taken mucinex, clartin, ect. With no improvement.  Encouraged pt to take sudafed and if she continues to have pain we can place referral to ENT. Discussed round ligament pain and information belly band given. Reviewed anatomy u/s next visit. She verbalizes and agrees to plan. Follow up 4 wks.   Doreene BurkeAnnie Ennis Heavner, CNM

## 2018-09-09 LAB — GLUCOSE, 1 HOUR GESTATIONAL: Gestational Diabetes Screen: 100 mg/dL (ref 65–139)

## 2018-09-17 ENCOUNTER — Encounter: Payer: Self-pay | Admitting: Certified Nurse Midwife

## 2018-10-08 ENCOUNTER — Other Ambulatory Visit: Payer: Managed Care, Other (non HMO)

## 2018-10-08 ENCOUNTER — Ambulatory Visit (INDEPENDENT_AMBULATORY_CARE_PROVIDER_SITE_OTHER): Payer: Managed Care, Other (non HMO) | Admitting: Obstetrics and Gynecology

## 2018-10-08 VITALS — BP 107/65 | HR 87 | Wt 249.8 lb

## 2018-10-08 DIAGNOSIS — Z3492 Encounter for supervision of normal pregnancy, unspecified, second trimester: Secondary | ICD-10-CM

## 2018-10-08 LAB — POCT URINALYSIS DIPSTICK OB
Blood, UA: NEGATIVE
GLUCOSE, UA: NEGATIVE
Ketones, UA: NEGATIVE
Nitrite, UA: NEGATIVE
Spec Grav, UA: 1.02 (ref 1.010–1.025)
Urobilinogen, UA: NEGATIVE E.U./dL — AB
pH, UA: 7.5 (ref 5.0–8.0)

## 2018-10-08 NOTE — Progress Notes (Signed)
ROB- doing well, headaches are better with adding magnesium. Still needs anatomy scan, discussed classes.

## 2018-10-21 ENCOUNTER — Other Ambulatory Visit: Payer: Managed Care, Other (non HMO)

## 2018-10-22 ENCOUNTER — Telehealth: Payer: Self-pay | Admitting: Obstetrics and Gynecology

## 2018-10-22 NOTE — Telephone Encounter (Signed)
LVM for patient to call office to reschedule u/s that is @ [facility]/ We now have u/s available at [facility]. Thanks

## 2018-10-28 ENCOUNTER — Telehealth: Payer: Self-pay

## 2018-10-28 NOTE — Telephone Encounter (Signed)
Coronavirus (COVID-19) Are you at risk?  Are you at risk for the Coronavirus (COVID-19)?  To be considered HIGH RISK for Coronavirus (COVID-19), you have to meet the following criteria:  . Traveled to [place], [place], [place], [place] or [place]; or in the [place] to [place], [place], [place], or [place]; and have fever, cough, and shortness of breath within the last 2 weeks of travel OR . Been in close contact with a person diagnosed with COVID-19 within the last 2 weeks and have fever, cough, and shortness of breath . IF YOU DO NOT MEET THESE CRITERIA, YOU ARE CONSIDERED LOW RISK FOR COVID-19.  What to do if you are HIGH RISK for COVID-19?  . If you are having a medical emergency, call 911. . Seek medical care right away. Before you go to a doctor's office, urgent care or emergency department, call ahead and tell them about your recent travel, contact with someone diagnosed with COVID-19, and your symptoms. You should receive instructions from your physician's office regarding next steps of care.  . When you arrive at healthcare provider, tell the healthcare staff immediately you have returned from visiting [place], [place], [place], [place] or [place]; or traveled in the [place] to [place], [place], [place], or [place]; in the last two weeks or you have been in close contact with a person diagnosed with COVID-19 in the last 2 weeks.   . Tell the health care staff about your symptoms: fever, cough and shortness of breath. . After you have been seen by a medical provider, you will be either: o Tested for (COVID-19) and discharged home on quarantine except to seek medical care if symptoms worsen, and asked to  - Stay home and avoid contact with others until you get your results (4-5 days)  - Avoid travel on public transportation if possible (such as bus, train, or airplane) or o Sent to the Emergency Department by EMS for evaluation, COVID-19 testing, and possible  admission depending on your condition and test results.  What to do if you are LOW RISK for COVID-19?  Reduce your risk of any infection by using the same precautions used for avoiding the common cold or flu:  . Wash your hands often with soap and warm water for at least 20 seconds.  If soap and water are not readily available, use an alcohol-based hand sanitizer with at least 60% alcohol.  . If coughing or sneezing, cover your mouth and nose by coughing or sneezing into the elbow areas of your shirt or coat, into a tissue or into your sleeve (not your hands). . Avoid shaking hands with others and consider head nods or verbal greetings only. . Avoid touching your eyes, nose, or mouth with unwashed hands.  . Avoid close contact with people who are sick. . Avoid places or events with large numbers of people in one location, like concerts or sporting events. . Carefully consider travel plans you have or are making. . If you are planning any travel outside or inside the [place], visit the CDC's Travelers' Health webpage for the latest health notices. . If you have some symptoms but not all symptoms, continue to monitor at home and seek medical attention if your symptoms worsen. . If you are having a medical emergency, call 911.   ADDITIONAL HEALTHCARE OPTIONS FOR PATIENTS   Telehealth / e-Visit: [email or web address]         [facility]: [phone]  [facility]: [phone]                   [facility]: [phone]  Prescreened covid 19/flu- neg. cm 

## 2018-10-29 ENCOUNTER — Ambulatory Visit (INDEPENDENT_AMBULATORY_CARE_PROVIDER_SITE_OTHER): Payer: Managed Care, Other (non HMO)

## 2018-10-29 ENCOUNTER — Other Ambulatory Visit: Payer: Self-pay

## 2018-10-29 ENCOUNTER — Inpatient Hospital Stay: Admission: RE | Admit: 2018-10-29 | Payer: Managed Care, Other (non HMO) | Source: Ambulatory Visit

## 2018-10-29 DIAGNOSIS — Z363 Encounter for antenatal screening for malformations: Secondary | ICD-10-CM

## 2018-10-29 DIAGNOSIS — Z131 Encounter for screening for diabetes mellitus: Secondary | ICD-10-CM

## 2018-11-09 ENCOUNTER — Encounter: Payer: Managed Care, Other (non HMO) | Admitting: Certified Nurse Midwife

## 2018-11-11 ENCOUNTER — Telehealth: Payer: Self-pay

## 2018-11-11 ENCOUNTER — Telehealth: Payer: Self-pay | Admitting: Certified Nurse Midwife

## 2018-11-11 NOTE — Telephone Encounter (Signed)
Coronavirus (COVID-19) Are you at risk?  Are you at risk for the Coronavirus (COVID-19)?  To be considered HIGH RISK for Coronavirus (COVID-19), you have to meet the following criteria:  . Traveled to [place], [place], [place], [place] or [place]; or in the [place] to [place], [place], [place], or [place]; and have fever, cough, and shortness of breath within the last 2 weeks of travel OR . Been in close contact with a person diagnosed with COVID-19 within the last 2 weeks and have fever, cough, and shortness of breath . IF YOU DO NOT MEET THESE CRITERIA, YOU ARE CONSIDERED LOW RISK FOR COVID-19.  What to do if you are HIGH RISK for COVID-19?  . If you are having a medical emergency, call 911. . Seek medical care right away. Before you go to a doctor's office, urgent care or emergency department, call ahead and tell them about your recent travel, contact with someone diagnosed with COVID-19, and your symptoms. You should receive instructions from your physician's office regarding next steps of care.  . When you arrive at healthcare provider, tell the healthcare staff immediately you have returned from visiting [place], [place], [place], [place] or [place]; or traveled in the [place] to [place], [place], [place], or [place]; in the last two weeks or you have been in close contact with a person diagnosed with COVID-19 in the last 2 weeks.   . Tell the health care staff about your symptoms: fever, cough and shortness of breath. . After you have been seen by a medical provider, you will be either: o Tested for (COVID-19) and discharged home on quarantine except to seek medical care if symptoms worsen, and asked to  - Stay home and avoid contact with others until you get your results (4-5 days)  - Avoid travel on public transportation if possible (such as bus, train, or airplane) or o Sent to the Emergency Department by EMS for evaluation, COVID-19 testing, and possible  admission depending on your condition and test results.  What to do if you are LOW RISK for COVID-19?  Reduce your risk of any infection by using the same precautions used for avoiding the common cold or flu:  . Wash your hands often with soap and warm water for at least 20 seconds.  If soap and water are not readily available, use an alcohol-based hand sanitizer with at least 60% alcohol.  . If coughing or sneezing, cover your mouth and nose by coughing or sneezing into the elbow areas of your shirt or coat, into a tissue or into your sleeve (not your hands). . Avoid shaking hands with others and consider head nods or verbal greetings only. . Avoid touching your eyes, nose, or mouth with unwashed hands.  . Avoid close contact with people who are Shelia Daniels. . Avoid places or events with large numbers of people in one location, like concerts or sporting events. . Carefully consider travel plans you have or are making. . If you are planning any travel outside or inside the [place], visit the CDC's Travelers' Health webpage for the latest health notices. . If you have some symptoms but not all symptoms, continue to monitor at home and seek medical attention if your symptoms worsen. . If you are having a medical emergency, call 911.  11/11/18 SCREENING NEG SLS ADDITIONAL HEALTHCARE OPTIONS FOR PATIENTS  [facility] Tele[facility] / e-Visit: [email or web address]         [facility]: [phone]    [facility]: [phone]                   [facility]: [phone]  

## 2018-11-11 NOTE — Telephone Encounter (Signed)
The patient called and stated that she missed a call from Shelia Daniels to do her pre-screening for tomorrow's appointment on 11/12/18. Please advise.

## 2018-11-12 ENCOUNTER — Other Ambulatory Visit: Payer: Self-pay

## 2018-11-12 ENCOUNTER — Ambulatory Visit (INDEPENDENT_AMBULATORY_CARE_PROVIDER_SITE_OTHER): Payer: Managed Care, Other (non HMO) | Admitting: Certified Nurse Midwife

## 2018-11-12 VITALS — BP 114/76 | HR 86 | Wt 253.1 lb

## 2018-11-12 DIAGNOSIS — Z130 Encounter for screening for diseases of the blood and blood-forming organs and certain disorders involving the immune mechanism: Secondary | ICD-10-CM

## 2018-11-12 DIAGNOSIS — Z3A23 23 weeks gestation of pregnancy: Secondary | ICD-10-CM

## 2018-11-12 DIAGNOSIS — Z113 Encounter for screening for infections with a predominantly sexual mode of transmission: Secondary | ICD-10-CM

## 2018-11-12 DIAGNOSIS — Z131 Encounter for screening for diabetes mellitus: Secondary | ICD-10-CM

## 2018-11-12 DIAGNOSIS — Z3492 Encounter for supervision of normal pregnancy, unspecified, second trimester: Secondary | ICD-10-CM

## 2018-11-12 LAB — POCT URINALYSIS DIPSTICK OB
Bilirubin, UA: NEGATIVE
Blood, UA: NEGATIVE
Glucose, UA: NEGATIVE
Ketones, UA: NEGATIVE
Leukocytes, UA: NEGATIVE
Nitrite, UA: NEGATIVE
Spec Grav, UA: 1.01
Urobilinogen, UA: 0.2 U/dL
pH, UA: 6

## 2018-11-12 NOTE — Patient Instructions (Signed)
Round Ligament Pain  The round ligament is a cord of muscle and tissue that helps support the uterus. It can become a source of pain during pregnancy if it becomes stretched or twisted as the baby grows. The pain usually begins in the second trimester (13-28 weeks) of pregnancy, and it can come and go until the baby is delivered. It is not a serious problem, and it does not cause harm to the baby. Round ligament pain is usually a short, sharp, and pinching pain, but it can also be a dull, lingering, and aching pain. The pain is felt in the lower side of the abdomen or in the groin. It usually starts deep in the groin and moves up to the outside of the hip area. The pain may occur when you:  Suddenly change position, such as quickly going from a sitting to standing position.  Roll over in bed.  Cough or sneeze.  Do physical activity. Follow these instructions at home:   Watch your condition for any changes.  When the pain starts, relax. Then try any of these methods to help with the pain: ? Sitting down. ? Flexing your knees up to your abdomen. ? Lying on your side with one pillow under your abdomen and another pillow between your legs. ? Sitting in a warm bath for 15-20 minutes or until the pain goes away.  Take over-the-counter and prescription medicines only as told by your health care provider.  Move slowly when you sit down or stand up.  Avoid long walks if they cause pain.  Stop or reduce your physical activities if they cause pain.  Keep all follow-up visits as told by your health care provider. This is important. Contact a health care provider if:  Your pain does not go away with treatment.  You feel pain in your back that you did not have before.  Your medicine is not helping. Get help right away if:  You have a fever or chills.  You develop uterine contractions.  You have vaginal bleeding.  You have nausea or vomiting.  You have diarrhea.  You have pain  when you urinate. Summary  Round ligament pain is felt in the lower abdomen or groin. It is usually a short, sharp, and pinching pain. It can also be a dull, lingering, and aching pain.  This pain usually begins in the second trimester (13-28 weeks). It occurs because the uterus is stretching with the growing baby, and it is not harmful to the baby.  You may notice the pain when you suddenly change position, when you cough or sneeze, or during physical activity.  Relaxing, flexing your knees to your abdomen, lying on one side, or taking a warm bath may help to get rid of the pain.  Get help from your health care provider if the pain does not go away or if you have vaginal bleeding, nausea, vomiting, diarrhea, or painful urination. This information is not intended to replace advice given to you by your health care provider. Make sure you discuss any questions you have with your health care provider. Document Released: 04/30/2008 Document Revised: 01/07/2018 Document Reviewed: 01/07/2018 Elsevier Interactive Patient Education  2019 [employer] Back Pain in Pregnancy Back pain during pregnancy is common. Back pain may be caused by several factors that are related to changes during your pregnancy. Follow these instructions at home: Managing pain, stiffness, and swelling      If directed, for sudden (acute) back pain, put ice on the  painful area. ? Put ice in a plastic bag. ? Place a towel between your skin and the bag. ? Leave the ice on for 20 minutes, 2-3 times per day.  If directed, apply heat to the affected area before you exercise. Use the heat source that your health care provider recommends, such as a moist heat pack or a heating pad. ? Place a towel between your skin and the heat source. ? Leave the heat on for 20-30 minutes. ? Remove the heat if your skin turns bright red. This is especially important if you are unable to feel pain, heat, or cold. You may have a greater risk  of getting burned.  If directed, massage the affected area. Activity  Exercise as told by your health care provider. Gentle exercise is the best way to prevent or manage back pain.  Listen to your body when lifting. If lifting hurts, ask for help or bend your knees. This uses your leg muscles instead of your back muscles.  Squat down when picking up something from the floor. Do not bend over.  Only use bed rest for short periods as told by your health care provider. Bed rest should only be used for the most severe episodes of back pain. Standing, sitting, and lying down  Do not stand in one place for long periods of time.  Use good posture when sitting. Make sure your head rests over your shoulders and is not hanging forward. Use a pillow on your lower back if necessary.  Try sleeping on your side, preferably the left side, with a pregnancy support pillow or 1-2 regular pillows between your legs. ? If you have back pain after a night's rest, your bed may be too soft. ? A firm mattress may provide more support for your back during pregnancy. General instructions  Do not wear high heels.  Eat a healthy diet. Try to gain weight within your health care provider's recommendations.  Use a maternity girdle, elastic sling, or back brace as told by your health care provider.  Take over-the-counter and prescription medicines only as told by your health care provider.  Work with a physical therapist or massage therapist to find ways to manage back pain. Acupuncture or massage therapy may be helpful.  Keep all follow-up visits as told by your health care provider. This is important. Contact a health care provider if:  Your back pain interferes with your daily activities.  You have increasing pain in other parts of your body. Get help right away if:  You develop numbness, tingling, weakness, or problems with the use of your arms or legs.  You develop severe back pain that is not  controlled with medicine.  You have a change in bowel or bladder control.  You develop shortness of breath, dizziness, or you faint.  You develop nausea, vomiting, or sweating.  You have back pain that is a rhythmic, cramping pain similar to labor pains. Labor pain is usually 1-2 minutes apart, lasts for about 1 minute, and involves a bearing down feeling or pressure in your pelvis.  You have back pain and your water breaks or you have vaginal bleeding.  You have back pain or numbness that travels down your leg.  Your back pain developed after you fell.  You develop pain on one side of your back.  You see blood in your urine.  You develop skin blisters in the area of your back pain. Summary  Back pain may be caused by several  factors that are related to changes during your pregnancy.  Follow instructions as told by your health care provider for managing pain, stiffness, and swelling.  Exercise as told by your health care provider. Gentle exercise is the best way to prevent or manage back pain.  Take over-the-counter and prescription medicines only as told by your health care provider.  Keep all follow-up visits as told by your health care provider. This is important. This information is not intended to replace advice given to you by your health care provider. Make sure you discuss any questions you have with your health care provider. Document Released: 10/30/2005 Document Revised: 01/07/2018 Document Reviewed: 01/07/2018 Elsevier Interactive Patient Education  2019 [employer]. Fetal Movement Counts Patient Name: ________________________________________________ Patient Due Date: ____________________ What is a fetal movement count?  A fetal movement count is the number of times that you feel your baby move during a certain amount of time. This may also be called a fetal kick count. A fetal movement count is recommended for every pregnant woman. You may be asked to start  counting fetal movements as early as week 28 of your pregnancy. Pay attention to when your baby is most active. You may notice your baby's sleep and wake cycles. You may also notice things that make your baby move more. You should do a fetal movement count:  When your baby is normally most active.  At the same time each day. A good time to count movements is while you are resting, after having something to eat and drink. How do I count fetal movements? 1. Find a quiet, comfortable area. Sit, or lie down on your side. 2. Write down the date, the start time and stop time, and the number of movements that you felt between those two times. Take this information with you to your health care visits. 3. For 2 hours, count kicks, flutters, swishes, rolls, and jabs. You should feel at least 10 movements during 2 hours. 4. You may stop counting after you have felt 10 movements. 5. If you do not feel 10 movements in 2 hours, have something to eat and drink. Then, keep resting and counting for 1 hour. If you feel at least 4 movements during that hour, you may stop counting. Contact a health care provider if:  You feel fewer than 4 movements in 2 hours.  Your baby is not moving like he or she usually does. Date: ____________ Start time: ____________ Stop time: ____________ Movements: ____________ Date: ____________ Start time: ____________ Stop time: ____________ Movements: ____________ Date: ____________ Start time: ____________ Stop time: ____________ Movements: ____________ Date: ____________ Start time: ____________ Stop time: ____________ Movements: ____________ Date: ____________ Start time: ____________ Stop time: ____________ Movements: ____________ Date: ____________ Start time: ____________ Stop time: ____________ Movements: ____________ Date: ____________ Start time: ____________ Stop time: ____________ Movements: ____________ Date: ____________ Start time: ____________ Stop time: ____________  Movements: ____________ Date: ____________ Start time: ____________ Stop time: ____________ Movements: ____________ This information is not intended to replace advice given to you by your health care provider. Make sure you discuss any questions you have with your health care provider. Document Released: 08/21/2006 Document Revised: 03/20/2016 Document Reviewed: 08/31/2015 Elsevier Interactive Patient Education  2019 [employer]. Third Trimester of Pregnancy  The third trimester is from week 28 through week 40 (months 7 through 9). This trimester is when your unborn baby (fetus) is growing very fast. At the end of the ninth month, the unborn baby is about 20 inches in length.  It weighs about 6-10 pounds. Follow these instructions at home: Medicines  Take over-the-counter and prescription medicines only as told by your doctor. Some medicines are safe and some medicines are not safe during pregnancy.  Take a prenatal vitamin that contains at least 600 micrograms (mcg) of folic acid.  If you have trouble pooping (constipation), take medicine that will make your stool soft (stool softener) if your doctor approves. Eating and drinking   Eat regular, healthy meals.  Avoid raw meat and uncooked cheese.  If you get low calcium from the food you eat, talk to your doctor about taking a daily calcium supplement.  Eat four or five small meals rather than three large meals a day.  Avoid foods that are high in fat and sugars, such as fried and sweet foods.  To prevent constipation: ? Eat foods that are high in fiber, like fresh fruits and vegetables, whole grains, and beans. ? Drink enough fluids to keep your pee (urine) clear or pale yellow. Activity  Exercise only as told by your doctor. Stop exercising if you start to have cramps.  Avoid heavy lifting, wear low heels, and sit up straight.  Do not exercise if it is too hot, too humid, or if you are in a place of great height (high  altitude).  You may continue to have sex unless your doctor tells you not to. Relieving pain and discomfort  Wear a good support bra if your breasts are tender.  Take frequent breaks and rest with your legs raised if you have leg cramps or low back pain.  Take warm water baths (sitz baths) to soothe pain or discomfort caused by hemorrhoids. Use hemorrhoid cream if your doctor approves.  If you develop puffy, bulging veins (varicose veins) in your legs: ? Wear support hose or compression stockings as told by your doctor. ? Raise (elevate) your feet for 15 minutes, 3-4 times a day. ? Limit salt in your food. Safety  Wear your seat belt when driving.  Make a list of emergency phone numbers, including numbers for family, friends, the hospital, and police and fire departments. Preparing for your baby's arrival To prepare for the arrival of your baby:  Take prenatal classes.  Practice driving to the hospital.  Visit the hospital and tour the maternity area.  Talk to your work about taking leave once the baby comes.  Pack your hospital bag.  Prepare the baby's room.  Go to your doctor visits.  Buy a rear-facing car seat. Learn how to install it in your car. General instructions  Do not use hot tubs, steam rooms, or saunas.  Do not use any products that contain nicotine or tobacco, such as cigarettes and e-cigarettes. If you need help quitting, ask your doctor.  Do not drink alcohol.  Do not douche or use tampons or scented sanitary pads.  Do not cross your legs for long periods of time.  Do not travel for long distances unless you must. Only do so if your doctor says it is okay.  Visit your dentist if you have not gone during your pregnancy. Use a soft toothbrush to brush your teeth. Be gentle when you floss.  Avoid cat litter boxes and soil used by cats. These carry germs that can cause birth defects in the baby and can cause a loss of your baby (miscarriage) or  stillbirth.  Keep all your prenatal visits as told by your doctor. This is important. Contact a doctor if:  You are  not sure if you are in labor or if your water has broken.  You are dizzy.  You have mild cramps or pressure in your lower belly.  You have a nagging pain in your belly area.  You continue to feel sick to your stomach, you throw up, or you have watery poop.  You have bad smelling fluid coming from your vagina.  You have pain when you pee. Get help right away if:  You have a fever.  You are leaking fluid from your vagina.  You are spotting or bleeding from your vagina.  You have severe belly cramps or pain.  You lose or gain weight quickly.  You have trouble catching your breath and have chest pain.  You notice sudden or extreme puffiness (swelling) of your face, hands, ankles, feet, or legs.  You have not felt the baby move in over an hour.  You have severe headaches that do not go away with medicine.  You have trouble seeing.  You are leaking, or you are having a gush of fluid, from your vagina before you are 37 weeks.  You have regular belly spasms (contractions) before you are 37 weeks. Summary  The third trimester is from week 28 through week 40 (months 7 through 9). This time is when your unborn baby is growing very fast.  Follow your doctor's advice about medicine, food, and activity.  Get ready for the arrival of your baby by taking prenatal classes, getting all the baby items ready, preparing the baby's room, and visiting your doctor to be checked.  Get help right away if you are bleeding from your vagina, or you have chest pain and trouble catching your breath, or if you have not felt your baby move in over an hour. This information is not intended to replace advice given to you by your health care provider. Make sure you discuss any questions you have with your health care provider. Document Released: 10/16/2009 Document Revised: 08/27/2016  Document Reviewed: 08/27/2016 Elsevier Interactive Patient Education  2019 [employer].

## 2018-11-12 NOTE — Progress Notes (Signed)
ROB-Doing well. Discussed COVID restrictions and precautions. Anatomy scan from 10/29/2018 reviewed with patient; findings complete and normal, verbalized understanding. Anticipatory guidance regarding course of prenatal care. Third trimester handouts given. Reviewed red flag symptoms and when to call. RTC x 5 weeks for 28 week labs, TDaP, and ROB or sooner if needed.   Location: [facility] Date of Service: 10/29/2018   Indications:Anatomy Ultrasound Findings:  Singleton intrauterine pregnancy is visualized with FHR at 152 BPM. Biometrics give an (U/S) Gestational age of [redacted]w[redacted]d and an (U/S) EDD of 02/28/2019; this correlates with the clinically established Estimated Date of Delivery: 02/22/19  Fetal presentation is Variable.  EFW: 502 g (1 lb 2oz). Placenta: posterior. Grade: 0 AFI: subjectively normal.  Anatomic survey is complete and normal; Gender - surprise.    Right Ovary is not normal in appearance. Left Ovary is not normal appearance. Survey of the adnexa demonstrates no adnexal masses. There is no free peritoneal fluid in the cul de sac.  Impression: 1. [redacted]w[redacted]d Viable Singleton Intrauterine pregnancy by U/S. 2. (U/S) EDD is consistent with Clinically established Estimated Date of Delivery: 02/22/19 . 3. Normal Anatomy Scan  Recommendations: 1.Clinical correlation with the patient's History and Physical Exam.

## 2018-11-12 NOTE — Progress Notes (Signed)
ROB- pt is doing well 

## 2018-12-16 ENCOUNTER — Encounter: Payer: Managed Care, Other (non HMO) | Admitting: Certified Nurse Midwife

## 2018-12-16 ENCOUNTER — Other Ambulatory Visit: Payer: Managed Care, Other (non HMO)

## 2018-12-17 ENCOUNTER — Telehealth: Payer: Self-pay

## 2018-12-17 ENCOUNTER — Encounter: Payer: Managed Care, Other (non HMO) | Admitting: Certified Nurse Midwife

## 2018-12-17 ENCOUNTER — Other Ambulatory Visit: Payer: Managed Care, Other (non HMO)

## 2018-12-17 NOTE — Telephone Encounter (Signed)
Coronavirus (COVID-19) Are you at risk?  Are you at risk for the Coronavirus (COVID-19)?  To be considered HIGH RISK for Coronavirus (COVID-19), you have to meet the following criteria:  . Traveled to [place], [place], [place], [place] or [place]; or in the [place] to [place], [place], [place], or [place]; and have fever, cough, and shortness of breath within the last 2 weeks of travel OR . Been in close contact with a person diagnosed with COVID-19 within the last 2 weeks and have fever, cough, and shortness of breath . IF YOU DO NOT MEET THESE CRITERIA, YOU ARE CONSIDERED LOW RISK FOR COVID-19.  What to do if you are HIGH RISK for COVID-19?  . If you are having a medical emergency, call 911. . Seek medical care right away. Before you go to a doctor's office, urgent care or emergency department, call ahead and tell them about your recent travel, contact with someone diagnosed with COVID-19, and your symptoms. You should receive instructions from your physician's office regarding next steps of care.  . When you arrive at healthcare provider, tell the healthcare staff immediately you have returned from visiting [place], [place], [place], [place] or [place]; or traveled in the [place] to [place], [place], [place], or [place]; in the last two weeks or you have been in close contact with a person diagnosed with COVID-19 in the last 2 weeks.   . Tell the health care staff about your symptoms: fever, cough and shortness of breath. . After you have been seen by a medical provider, you will be either: o Tested for (COVID-19) and discharged home on quarantine except to seek medical care if symptoms worsen, and asked to  - Stay home and avoid contact with others until you get your results (4-5 days)  - Avoid travel on public transportation if possible (such as bus, train, or airplane) or o Sent to the Emergency Department by EMS for evaluation, COVID-19 testing, and possible  admission depending on your condition and test results.  What to do if you are LOW RISK for COVID-19?  Reduce your risk of any infection by using the same precautions used for avoiding the common cold or flu:  . Wash your hands often with soap and warm water for at least 20 seconds.  If soap and water are not readily available, use an alcohol-based hand sanitizer with at least 60% alcohol.  . If coughing or sneezing, cover your mouth and nose by coughing or sneezing into the elbow areas of your shirt or coat, into a tissue or into your sleeve (not your hands). . Avoid shaking hands with others and consider head nods or verbal greetings only. . Avoid touching your eyes, nose, or mouth with unwashed hands.  . Avoid close contact with people who are Shelia Daniels. . Avoid places or events with large numbers of people in one location, like concerts or sporting events. . Carefully consider travel plans you have or are making. . If you are planning any travel outside or inside the [place], visit the CDC's Travelers' Health webpage for the latest health notices. . If you have some symptoms but not all symptoms, continue to monitor at home and seek medical attention if your symptoms worsen. . If you are having a medical emergency, call 911.  12/17/18 SCREENING NEG SLS ADDITIONAL HEALTHCARE OPTIONS FOR PATIENTS  [facility] / e-Visit: [email or web address]         [facility]: [phone]    [facility]: [phone]                   [facility]: [phone]  

## 2018-12-18 ENCOUNTER — Encounter: Payer: Self-pay | Admitting: Certified Nurse Midwife

## 2018-12-18 ENCOUNTER — Other Ambulatory Visit: Payer: Self-pay

## 2018-12-18 ENCOUNTER — Other Ambulatory Visit: Payer: Managed Care, Other (non HMO)

## 2018-12-18 ENCOUNTER — Ambulatory Visit (INDEPENDENT_AMBULATORY_CARE_PROVIDER_SITE_OTHER): Payer: Managed Care, Other (non HMO) | Admitting: Certified Nurse Midwife

## 2018-12-18 VITALS — BP 91/59 | HR 87 | Wt 257.2 lb

## 2018-12-18 DIAGNOSIS — Z23 Encounter for immunization: Secondary | ICD-10-CM

## 2018-12-18 DIAGNOSIS — Z131 Encounter for screening for diabetes mellitus: Secondary | ICD-10-CM

## 2018-12-18 DIAGNOSIS — Z113 Encounter for screening for infections with a predominantly sexual mode of transmission: Secondary | ICD-10-CM

## 2018-12-18 DIAGNOSIS — Z130 Encounter for screening for diseases of the blood and blood-forming organs and certain disorders involving the immune mechanism: Secondary | ICD-10-CM

## 2018-12-18 DIAGNOSIS — Z3492 Encounter for supervision of normal pregnancy, unspecified, second trimester: Secondary | ICD-10-CM

## 2018-12-18 DIAGNOSIS — Z3493 Encounter for supervision of normal pregnancy, unspecified, third trimester: Secondary | ICD-10-CM

## 2018-12-18 LAB — POCT URINALYSIS DIPSTICK OB
Bilirubin, UA: NEGATIVE
Blood, UA: NEGATIVE
Glucose, UA: NEGATIVE
Ketones, UA: NEGATIVE
Nitrite, UA: NEGATIVE
POC,PROTEIN,UA: NEGATIVE
Spec Grav, UA: 1.01 (ref 1.010–1.025)
Urobilinogen, UA: 0.2 E.U./dL
pH, UA: 6 (ref 5.0–8.0)

## 2018-12-18 MED ORDER — TETANUS-DIPHTH-ACELL PERTUSSIS 5-2.5-18.5 LF-MCG/0.5 IM SUSP
0.5000 mL | Freq: Once | INTRAMUSCULAR | Status: AC
  Administered 2018-12-18: 0.5 mL via INTRAMUSCULAR

## 2018-12-18 NOTE — Patient Instructions (Signed)
Glucose Tolerance Test During Pregnancy Why am I having this test? The glucose tolerance test (GTT) is done to check how your body processes sugar (glucose). This is one of several tests used to diagnose diabetes that develops during pregnancy (gestational diabetes mellitus). Gestational diabetes is a temporary form of diabetes that some women develop during pregnancy. It usually occurs during the second trimester of pregnancy and goes away after delivery. Testing (screening) for gestational diabetes usually occurs between 24 and 28 weeks of pregnancy. You may have the GTT test after having a 1-hour glucose screening test if the results from that test indicate that you may have gestational diabetes. You may also have this test if:  You have a history of gestational diabetes.  You have a history of giving birth to very large babies or have experienced repeated fetal loss (stillbirth).  You have signs and symptoms of diabetes, such as: ? Changes in your vision. ? Tingling or numbness in your hands or feet. ? Changes in hunger, thirst, and urination that are not otherwise explained by your pregnancy. What is being tested? This test measures the amount of glucose in your blood at different times during a period of 3 hours. This indicates how well your body is able to process glucose. What kind of sample is taken?  Blood samples are required for this test. They are usually collected by inserting a needle into a blood vessel. How do I prepare for this test?  For 3 days before your test, eat normally. Have plenty of carbohydrate-rich foods.  Follow instructions from your health care provider about: ? Eating or drinking restrictions on the day of the test. You may be asked to not eat or drink anything other than water (fast) starting 8-10 hours before the test. ? Changing or stopping your regular medicines. Some medicines may interfere with this test. Tell a health care provider about:  All  medicines you are taking, including vitamins, herbs, eye drops, creams, and over-the-counter medicines.  Any blood disorders you have.  Any surgeries you have had.  Any medical conditions you have. What happens during the test? First, your blood glucose will be measured. This is referred to as your fasting blood glucose, since you fasted before the test. Then, you will drink a glucose solution that contains a certain amount of glucose. Your blood glucose will be measured again 1, 2, and 3 hours after drinking the solution. This test takes about 3 hours to complete. You will need to stay at the testing location during this time. During the testing period:  Do not eat or drink anything other than the glucose solution.  Do not exercise.  Do not use any products that contain nicotine or tobacco, such as cigarettes and e-cigarettes. If you need help stopping, ask your health care provider. The testing procedure may vary among health care providers and hospitals. How are the results reported? Your results will be reported as milligrams of glucose per deciliter of blood (mg/dL) or millimoles per liter (mmol/L). Your health care provider will compare your results to normal ranges that were established after testing a large group of people (reference ranges). Reference ranges may vary among labs and hospitals. For this test, common reference ranges are:  Fasting: less than 95-105 mg/dL (5.3-5.8 mmol/L).  1 hour after drinking glucose: less than 180-190 mg/dL (10.0-10.5 mmol/L).  2 hours after drinking glucose: less than 155-165 mg/dL (8.6-9.2 mmol/L).  3 hours after drinking glucose: 140-145 mg/dL (7.8-8.1 mmol/L). What do the   results mean? Results within reference ranges are considered normal, meaning that your glucose levels are well-controlled. If two or more of your blood glucose levels are high, you may be diagnosed with gestational diabetes. If only one level is high, your health care  provider may suggest repeat testing or other tests to confirm a diagnosis. Talk with your health care provider about what your results mean. Questions to ask your health care provider Ask your health care provider, or the department that is doing the test:  When will my results be ready?  How will I get my results?  What are my treatment options?  What other tests do I need?  What are my next steps? Summary  The glucose tolerance test (GTT) is one of several tests used to diagnose diabetes that develops during pregnancy (gestational diabetes mellitus). Gestational diabetes is a temporary form of diabetes that some women develop during pregnancy.  You may have the GTT test after having a 1-hour glucose screening test if the results from that test indicate that you may have gestational diabetes. You may also have this test if you have any symptoms or risk factors for gestational diabetes.  Talk with your health care provider about what your results mean. This information is not intended to replace advice given to you by your health care provider. Make sure you discuss any questions you have with your health care provider. Document Released: 01/21/2012 Document Revised: 03/03/2017 Document Reviewed: 03/03/2017 Elsevier Interactive Patient Education  2019 Elsevier Inc.  

## 2018-12-18 NOTE — Progress Notes (Signed)
ROB doing well. Discussed birth control after baby, pamphlet given. Sample birth plan given and discussed. RPR/CBC/TDAP/BTC today. Will follow up with lab results. Return in 2 wks.   Doreene Burke, CNM

## 2018-12-18 NOTE — Progress Notes (Signed)
ROB- glucola done today, BC signed, tdap given, pt is doing well 

## 2018-12-19 LAB — CBC
Hematocrit: 32.6 % — ABNORMAL LOW (ref 34.0–46.6)
Hemoglobin: 10.9 g/dL — ABNORMAL LOW (ref 11.1–15.9)
MCH: 27.9 pg (ref 26.6–33.0)
MCHC: 33.4 g/dL (ref 31.5–35.7)
MCV: 84 fL (ref 79–97)
Platelets: 270 10*3/uL (ref 150–450)
RBC: 3.9 x10E6/uL (ref 3.77–5.28)
RDW: 13.7 % (ref 11.7–15.4)
WBC: 10 10*3/uL (ref 3.4–10.8)

## 2018-12-19 LAB — GLUCOSE, 1 HOUR GESTATIONAL: Gestational Diabetes Screen: 143 mg/dL — ABNORMAL HIGH (ref 65–139)

## 2018-12-19 LAB — RPR: RPR Ser Ql: NONREACTIVE

## 2018-12-21 ENCOUNTER — Other Ambulatory Visit: Payer: Self-pay | Admitting: Certified Nurse Midwife

## 2018-12-21 ENCOUNTER — Encounter: Payer: Self-pay | Admitting: Certified Nurse Midwife

## 2018-12-21 DIAGNOSIS — R739 Hyperglycemia, unspecified: Secondary | ICD-10-CM | POA: Insufficient documentation

## 2018-12-21 DIAGNOSIS — O99019 Anemia complicating pregnancy, unspecified trimester: Secondary | ICD-10-CM | POA: Insufficient documentation

## 2018-12-21 DIAGNOSIS — Z131 Encounter for screening for diabetes mellitus: Secondary | ICD-10-CM

## 2018-12-21 DIAGNOSIS — Z3493 Encounter for supervision of normal pregnancy, unspecified, third trimester: Secondary | ICD-10-CM

## 2018-12-23 ENCOUNTER — Telehealth: Payer: Self-pay

## 2018-12-23 NOTE — Telephone Encounter (Signed)
Coronavirus (COVID-19) Are you at risk?  Are you at risk for the Coronavirus (COVID-19)?  To be considered HIGH RISK for Coronavirus (COVID-19), you have to meet the following criteria:  . Traveled to [place], [place], [place], [place] or [place]; or in the [place] to [place], [place], [place], or [place]; and have fever, cough, and shortness of breath within the last 2 weeks of travel OR . Been in close contact with a person diagnosed with COVID-19 within the last 2 weeks and have fever, cough, and shortness of breath . IF YOU DO NOT MEET THESE CRITERIA, YOU ARE CONSIDERED LOW RISK FOR COVID-19.  What to do if you are HIGH RISK for COVID-19?  . If you are having a medical emergency, call 911. . Seek medical care right away. Before you go to a doctor's office, urgent care or emergency department, call ahead and tell them about your recent travel, contact with someone diagnosed with COVID-19, and your symptoms. You should receive instructions from your physician's office regarding next steps of care.  . When you arrive at healthcare provider, tell the healthcare staff immediately you have returned from visiting [place], [place], [place], [place] or [place]; or traveled in the [place] to [place], [place], [place], or [place]; in the last two weeks or you have been in close contact with a person diagnosed with COVID-19 in the last 2 weeks.   . Tell the health care staff about your symptoms: fever, cough and shortness of breath. . After you have been seen by a medical provider, you will be either: o Tested for (COVID-19) and discharged home on quarantine except to seek medical care if symptoms worsen, and asked to  - Stay home and avoid contact with others until you get your results (4-5 days)  - Avoid travel on public transportation if possible (such as bus, train, or airplane) or o Sent to the Emergency Department by EMS for evaluation, COVID-19 testing, and possible  admission depending on your condition and test results.  What to do if you are LOW RISK for COVID-19?  Reduce your risk of any infection by using the same precautions used for avoiding the common cold or flu:  . Wash your hands often with soap and warm water for at least 20 seconds.  If soap and water are not readily available, use an alcohol-based hand sanitizer with at least 60% alcohol.  . If coughing or sneezing, cover your mouth and nose by coughing or sneezing into the elbow areas of your shirt or coat, into a tissue or into your sleeve (not your hands). . Avoid shaking hands with others and consider head nods or verbal greetings only. . Avoid touching your eyes, nose, or mouth with unwashed hands.  . Avoid close contact with people who are sick. . Avoid places or events with large numbers of people in one location, like concerts or sporting events. . Carefully consider travel plans you have or are making. . If you are planning any travel outside or inside the [place], visit the CDC's Travelers' Health webpage for the latest health notices. . If you have some symptoms but not all symptoms, continue to monitor at home and seek medical attention if your symptoms worsen. . If you are having a medical emergency, call 911.   ADDITIONAL HEALTHCARE OPTIONS FOR PATIENTS  [facility] / e-Visit: [email or web address]         [facility]: [phone]  [facility]: [phone]                   [facility]: [phone]   Prescreened. Neg .cm 

## 2018-12-24 ENCOUNTER — Other Ambulatory Visit: Payer: Self-pay

## 2018-12-24 ENCOUNTER — Other Ambulatory Visit: Payer: Managed Care, Other (non HMO)

## 2018-12-24 DIAGNOSIS — Z3493 Encounter for supervision of normal pregnancy, unspecified, third trimester: Secondary | ICD-10-CM

## 2018-12-24 DIAGNOSIS — R739 Hyperglycemia, unspecified: Secondary | ICD-10-CM

## 2018-12-24 DIAGNOSIS — Z131 Encounter for screening for diabetes mellitus: Secondary | ICD-10-CM

## 2018-12-25 LAB — GESTATIONAL GLUCOSE TOLERANCE
Glucose, Fasting: 91 mg/dL (ref 65–94)
Glucose, GTT - 1 Hour: 144 mg/dL (ref 65–179)
Glucose, GTT - 2 Hour: 133 mg/dL (ref 65–154)
Glucose, GTT - 3 Hour: 100 mg/dL (ref 65–139)

## 2018-12-31 ENCOUNTER — Telehealth: Payer: Self-pay

## 2018-12-31 ENCOUNTER — Encounter: Payer: Self-pay | Admitting: Certified Nurse Midwife

## 2018-12-31 NOTE — Telephone Encounter (Signed)
Coronavirus (COVID-19) Are you at risk?  Are you at risk for the Coronavirus (COVID-19)?  To be considered HIGH RISK for Coronavirus (COVID-19), you have to meet the following criteria:  . Traveled to [place], [place], [place], [place] or [place]; or in the [place] to [place], [place], [place], or [place]; and have fever, cough, and shortness of breath within the last 2 weeks of travel OR . Been in close contact with a person diagnosed with COVID-19 within the last 2 weeks and have fever, cough, and shortness of breath . IF YOU DO NOT MEET THESE CRITERIA, YOU ARE CONSIDERED LOW RISK FOR COVID-19.  What to do if you are HIGH RISK for COVID-19?  . If you are having a medical emergency, call 911. . Seek medical care right away. Before you go to a doctor's office, urgent care or emergency department, call ahead and tell them about your recent travel, contact with someone diagnosed with COVID-19, and your symptoms. You should receive instructions from your physician's office regarding next steps of care.  . When you arrive at healthcare provider, tell the healthcare staff immediately you have returned from visiting [place], [place], [place], [place] or [place]; or traveled in the [place] to [place], [place], [place], or [place]; in the last two weeks or you have been in close contact with a person diagnosed with COVID-19 in the last 2 weeks.   . Tell the health care staff about your symptoms: fever, cough and shortness of breath. . After you have been seen by a medical provider, you will be either: o Tested for (COVID-19) and discharged home on quarantine except to seek medical care if symptoms worsen, and asked to  - Stay home and avoid contact with others until you get your results (4-5 days)  - Avoid travel on public transportation if possible (such as bus, train, or airplane) or o Sent to the Emergency Department by EMS for evaluation, COVID-19 testing, and possible  admission depending on your condition and test results.  What to do if you are LOW RISK for COVID-19?  Reduce your risk of any infection by using the same precautions used for avoiding the common cold or flu:  . Wash your hands often with soap and warm water for at least 20 seconds.  If soap and water are not readily available, use an alcohol-based hand sanitizer with at least 60% alcohol.  . If coughing or sneezing, cover your mouth and nose by coughing or sneezing into the elbow areas of your shirt or coat, into a tissue or into your sleeve (not your hands). . Avoid shaking hands with others and consider head nods or verbal greetings only. . Avoid touching your eyes, nose, or mouth with unwashed hands.  . Avoid close contact with people who are Shelia Daniels. . Avoid places or events with large numbers of people in one location, like concerts or sporting events. . Carefully consider travel plans you have or are making. . If you are planning any travel outside or inside the [place], visit the CDC's Travelers' Health webpage for the latest health notices. . If you have some symptoms but not all symptoms, continue to monitor at home and seek medical attention if your symptoms worsen. . If you are having a medical emergency, call 911.  12/31/18 SCREENING NEG SLS ADDITIONAL HEALTHCARE OPTIONS FOR PATIENTS  [facility] / e-Visit: [email or web address]         [facility]: [phone]    [facility]: [phone]                   [facility]: [phone]  

## 2019-01-01 ENCOUNTER — Other Ambulatory Visit: Payer: Self-pay

## 2019-01-01 ENCOUNTER — Ambulatory Visit (INDEPENDENT_AMBULATORY_CARE_PROVIDER_SITE_OTHER): Payer: Managed Care, Other (non HMO) | Admitting: Obstetrics and Gynecology

## 2019-01-01 VITALS — BP 112/77 | HR 101 | Wt 258.0 lb

## 2019-01-01 DIAGNOSIS — Z3493 Encounter for supervision of normal pregnancy, unspecified, third trimester: Secondary | ICD-10-CM

## 2019-01-01 DIAGNOSIS — Z0289 Encounter for other administrative examinations: Secondary | ICD-10-CM

## 2019-01-01 NOTE — Progress Notes (Signed)
ROB- pt is doing well 

## 2019-01-01 NOTE — Progress Notes (Signed)
ROB- doing well, feels better since starting iron supplement,has already done classes, discussed pain management options.

## 2019-01-15 ENCOUNTER — Telehealth: Payer: Self-pay

## 2019-01-15 NOTE — Telephone Encounter (Signed)
Coronavirus (COVID-19) Are you at risk?  Are you at risk for the Coronavirus (COVID-19)?  To be considered HIGH RISK for Coronavirus (COVID-19), you have to meet the following criteria:  . Traveled to [place], [place], [place], [place] or [place]; or in the [place] to [place], [place], [place], or [place]; and have fever, cough, and shortness of breath within the last 2 weeks of travel OR . Been in close contact with a person diagnosed with COVID-19 within the last 2 weeks and have fever, cough, and shortness of breath . IF YOU DO NOT MEET THESE CRITERIA, YOU ARE CONSIDERED LOW RISK FOR COVID-19.  What to do if you are HIGH RISK for COVID-19?  . If you are having a medical emergency, call 911. . Seek medical care right away. Before you go to a doctor's office, urgent care or emergency department, call ahead and tell them about your recent travel, contact with someone diagnosed with COVID-19, and your symptoms. You should receive instructions from your physician's office regarding next steps of care.  . When you arrive at healthcare provider, tell the healthcare staff immediately you have returned from visiting [place], [place], [place], [place] or [place]; or traveled in the [place] to [place], [place], [place], or [place]; in the last two weeks or you have been in close contact with a person diagnosed with COVID-19 in the last 2 weeks.   . Tell the health care staff about your symptoms: fever, cough and shortness of breath. . After you have been seen by a medical provider, you will be either: o Tested for (COVID-19) and discharged home on quarantine except to seek medical care if symptoms worsen, and asked to  - Stay home and avoid contact with others until you get your results (4-5 days)  - Avoid travel on public transportation if possible (such as bus, train, or airplane) or o Sent to the Emergency Department by EMS for evaluation, COVID-19 testing, and possible  admission depending on your condition and test results.  What to do if you are LOW RISK for COVID-19?  Reduce your risk of any infection by using the same precautions used for avoiding the common cold or flu:  . Wash your hands often with soap and warm water for at least 20 seconds.  If soap and water are not readily available, use an alcohol-based hand sanitizer with at least 60% alcohol.  . If coughing or sneezing, cover your mouth and nose by coughing or sneezing into the elbow areas of your shirt or coat, into a tissue or into your sleeve (not your hands). . Avoid shaking hands with others and consider head nods or verbal greetings only. . Avoid touching your eyes, nose, or mouth with unwashed hands.  . Avoid close contact with people who are sick. . Avoid places or events with large numbers of people in one location, like concerts or sporting events. . Carefully consider travel plans you have or are making. . If you are planning any travel outside or inside the [place], visit the CDC's Travelers' Health webpage for the latest health notices. . If you have some symptoms but not all symptoms, continue to monitor at home and seek medical attention if your symptoms worsen. . If you are having a medical emergency, call 911.   ADDITIONAL HEALTHCARE OPTIONS FOR PATIENTS   Telehealth / e-Visit: [email or web address]         [facility]: [phone]  [facility]: [phone]                   [facility]: [phone]   Pre-screen negative, DM.   

## 2019-01-18 ENCOUNTER — Other Ambulatory Visit: Payer: Self-pay

## 2019-01-18 ENCOUNTER — Ambulatory Visit (INDEPENDENT_AMBULATORY_CARE_PROVIDER_SITE_OTHER): Payer: Managed Care, Other (non HMO) | Admitting: Certified Nurse Midwife

## 2019-01-18 VITALS — BP 103/75 | HR 99 | Wt 260.6 lb

## 2019-01-18 DIAGNOSIS — Z3A35 35 weeks gestation of pregnancy: Secondary | ICD-10-CM

## 2019-01-18 DIAGNOSIS — G5600 Carpal tunnel syndrome, unspecified upper limb: Secondary | ICD-10-CM

## 2019-01-18 DIAGNOSIS — O26899 Other specified pregnancy related conditions, unspecified trimester: Secondary | ICD-10-CM

## 2019-01-18 DIAGNOSIS — O26893 Other specified pregnancy related conditions, third trimester: Secondary | ICD-10-CM

## 2019-01-18 DIAGNOSIS — Z3493 Encounter for supervision of normal pregnancy, unspecified, third trimester: Secondary | ICD-10-CM

## 2019-01-18 DIAGNOSIS — O99210 Obesity complicating pregnancy, unspecified trimester: Secondary | ICD-10-CM

## 2019-01-18 DIAGNOSIS — O99213 Obesity complicating pregnancy, third trimester: Secondary | ICD-10-CM

## 2019-01-18 LAB — POCT URINALYSIS DIPSTICK OB
Bilirubin, UA: NEGATIVE
Blood, UA: NEGATIVE
Ketones, UA: NEGATIVE
Leukocytes, UA: NEGATIVE
Nitrite, UA: NEGATIVE
Spec Grav, UA: 1.015 (ref 1.010–1.025)
Urobilinogen, UA: 0.2 E.U./dL
pH, UA: 6.5 (ref 5.0–8.0)

## 2019-01-18 NOTE — Progress Notes (Addendum)
ROB-Doing well, reports bilateral carpal tunnel symptoms and intermittent leg swelling. Negative for pre-eclampsia signs. Discussed home treatment measures, handout given. Advised to increase protein and water intake. Body mass index is 42.06 kg/m. Anticipatory guidance regarding course of prenatal care including induction of labor in the 39 week. Herbal prep handout given. Encouraged to order breast pump. Reviewed red flag symptoms and when to call. RTC x 1 weeks for 36 wk cultures, growth ultrasound and ROB or sooner if needed.

## 2019-01-18 NOTE — Patient Instructions (Addendum)
Fetal Movement Counts Patient Name: ________________________________________________ Patient Due Date: ____________________ What is a fetal movement count?  A fetal movement count is the number of times that you feel your baby move during a certain amount of time. This may also be called a fetal kick count. A fetal movement count is recommended for every pregnant woman. You may be asked to start counting fetal movements as early as week 28 of your pregnancy. Pay attention to when your baby is most active. You may notice your baby's sleep and wake cycles. You may also notice things that make your baby move more. You should do a fetal movement count:  When your baby is normally most active.  At the same time each day. A good time to count movements is while you are resting, after having something to eat and drink. How do I count fetal movements? 1. Find a quiet, comfortable area. Sit, or lie down on your side. 2. Write down the date, the start time and stop time, and the number of movements that you felt between those two times. Take this information with you to your health care visits. 3. For 2 hours, count kicks, flutters, swishes, rolls, and jabs. You should feel at least 10 movements during 2 hours. 4. You may stop counting after you have felt 10 movements. 5. If you do not feel 10 movements in 2 hours, have something to eat and drink. Then, keep resting and counting for 1 hour. If you feel at least 4 movements during that hour, you may stop counting. Contact a health care provider if:  You feel fewer than 4 movements in 2 hours.  Your baby is not moving like he or she usually does. Date: ____________ Start time: ____________ Stop time: ____________ Movements: ____________ Date: ____________ Start time: ____________ Stop time: ____________ Movements: ____________ Date: ____________ Start time: ____________ Stop time: ____________ Movements: ____________ Date: ____________ Start time:  ____________ Stop time: ____________ Movements: ____________ Date: ____________ Start time: ____________ Stop time: ____________ Movements: ____________ Date: ____________ Start time: ____________ Stop time: ____________ Movements: ____________ Date: ____________ Start time: ____________ Stop time: ____________ Movements: ____________ Date: ____________ Start time: ____________ Stop time: ____________ Movements: ____________ Date: ____________ Start time: ____________ Stop time: ____________ Movements: ____________ This information is not intended to replace advice given to you by your health care provider. Make sure you discuss any questions you have with your health care provider. Document Released: 08/21/2006 Document Revised: 03/20/2016 Document Reviewed: 08/31/2015 Elsevier Interactive Patient Education  2019 [employer] Carpal Tunnel Syndrome  Carpal tunnel syndrome is a condition that causes pain in your hand and arm. The carpal tunnel is a narrow area that is on the palm side of your wrist. Repeated wrist motion or certain diseases may cause swelling in the tunnel. This swelling can pinch the main nerve in the wrist (median nerve). What are the causes? This condition may be caused by:  Repeated wrist motions.  Wrist injuries.  Arthritis.  A sac of fluid (cyst) or abnormal growth (tumor) in the carpal tunnel.  Fluid buildup during pregnancy. Sometimes the cause is not known. What increases the risk? The following factors may make you more likely to develop this condition:  Having a job in which you move your wrist in the same way many times. This includes jobs like being a Midwifebutcher or a Conservation officer, naturecashier.  Being a woman.  Having other health conditions, such as: ? Diabetes. ? Obesity. ? A thyroid gland that is not active enough (hypothyroidism). ?  Kidney failure. What are the signs or symptoms? Symptoms of this condition include:  A tingling feeling in your fingers.  Tingling  or a loss of feeling (numbness) in your hand.  Pain in your entire arm. This pain may get worse when you bend your wrist and elbow for a long time.  Pain in your wrist that goes up your arm to your shoulder.  Pain that goes down into your palm or fingers.  A weak feeling in your hands. You may find it hard to grab and hold items. You may feel worse at night. How is this diagnosed? This condition is diagnosed with a medical history and physical exam. You may also have tests, such as:  Electromyogram (EMG). This test checks the signals that the nerves send to the muscles.  Nerve conduction study. This test checks how well signals pass through your nerves.  Imaging tests, such as X-rays, ultrasound, and MRI. These tests check for what might be the cause of your condition. How is this treated? This condition may be treated with:  Lifestyle changes. You will be asked to stop or change the activity that caused your problem.  Doing exercise and activities that make bones and muscles stronger (physical therapy).  Learning how to use your hand again (occupational therapy).  Medicines for pain and swelling (inflammation). You may have injections in your wrist.  A wrist splint.  Surgery. Follow these instructions at home: If you have a splint:  Wear the splint as told by your doctor. Remove it only as told by your doctor.  Loosen the splint if your fingers: ? Tingle. ? Lose feeling (become numb). ? Turn cold and blue.  Keep the splint clean.  If the splint is not waterproof: ? Do not let it get wet. ? Cover it with a watertight covering when you take a bath or a shower. Managing pain, stiffness, and swelling   If told, put ice on the painful area: ? If you have a removable splint, remove it as told by your doctor. ? Put ice in a plastic bag. ? Place a towel between your skin and the bag. ? Leave the ice on for 20 minutes, 2-3 times per day. General instructions  Take  over-the-counter and prescription medicines only as told by your doctor.  Rest your wrist from any activity that may cause pain. If needed, talk with your boss at work about changes that can help your wrist heal.  Do any exercises as told by your doctor, physical therapist, or occupational therapist.  Keep all follow-up visits as told by your doctor. This is important. Contact a doctor if:  You have new symptoms.  Medicine does not help your pain.  Your symptoms get worse. Get help right away if:  You have very bad numbness or tingling in your wrist or hand. Summary  Carpal tunnel syndrome is a condition that causes pain in your hand and arm.  It is often caused by repeated wrist motions.  Lifestyle changes and medicines are used to treat this problem. Surgery may help in very bad cases.  Follow your doctor's instructions about wearing a splint, resting your wrist, keeping follow-up visits, and calling for help. This information is not intended to replace advice given to you by your health care provider. Make sure you discuss any questions you have with your health care provider. Document Released: 07/11/2011 Document Revised: 11/28/2017 Document Reviewed: 11/28/2017 Elsevier Interactive Patient Education  2019 [place] Test A  nonstress test is a procedure that is done during pregnancy in order to check the baby's heartbeat. The procedure can help show if the baby (fetus) is healthy. It is commonly done when:  The baby is past his or her due date.  The pregnancy is high risk.  The baby is moving less than normal.  The mother has lost a pregnancy in the past.  The health care provider suspects a problem with the baby's growth.  There is too much or too little amniotic fluid. The procedure is often done in the third trimester of pregnancy to find out if an early delivery is needed and whether such a delivery is safe. During a nonstress test, the baby's  heartbeat is monitored when the baby is resting and when the baby is moving. If the baby is healthy, the heart rate will increase when he or she moves or kicks and will return to normal when he or she rests. Tell a health care provider about:  Any allergies you have.  Any medical conditions you have.  All medicines you are taking, including vitamins, herbs, eye drops, creams, and over-the-counter medicines. What are the risks? There are no risks to you or your baby from a nonstress test. This procedure should not be painful or uncomfortable. What happens before the procedure?  Eat a meal right before the test or as directed by your health care provider. Food may help encourage the baby to move.  Use the restroom right before the test. What happens during the procedure?  Two monitors will be placed on your abdomen. One will record the baby's heart rate and the other will record the contractions of your uterus.  You may be asked to lie down on your side or to sit upright.  You may be given a button to press when you feel your baby move.  Your health care provider will listen to your baby's heartbeat and recorded it. He or she may also watch your baby's heartbeat on a screen.  If the baby seems to be sleeping, you may be asked to drink some juice or soda, eat a snack, or change positions. The procedure may vary among health care providers and hospitals. What happens after the procedure?  Your health care provider will discuss the test results with you and make recommendations for the future. Depending on the results, your health care provider may order additional tests or another course of action.  If your health care provider gave you any diet or activity instructions, make sure to follow them.  Keep all follow-up visits as told by your health care provider. This is important. Summary  A nonstress test is a procedure that is done during pregnancy in order to check the baby's  heartbeat. The procedure can help show if the baby is healthy.  The procedure is often done in the third trimester of pregnancy to find out if an early delivery is needed and whether such a delivery is safe.  During a nonstress test, the baby's heartbeat is monitored when the baby is resting and when the baby is moving. If the baby is healthy, the heart rate will increase when he or she moves or kicks and will return to normal when he or she rests.  Your health care provider will discuss the test results with you and make recommendations for the future. This information is not intended to replace advice given to you by your health care provider. Make sure you discuss any questions you  have with your health care provider. Document Released: 07/12/2002 Document Revised: 10/31/2016 Document Reviewed: 10/31/2016 Elsevier Interactive Patient Education  2019 [employer]

## 2019-01-18 NOTE — Progress Notes (Signed)
ROB-Patient c/o bilateral hand numbness x1 week, using hand braces with some relief. Patient also c/o feet swelling x2 weeks, no HA's or visual changes.

## 2019-01-28 ENCOUNTER — Ambulatory Visit (INDEPENDENT_AMBULATORY_CARE_PROVIDER_SITE_OTHER): Payer: Managed Care, Other (non HMO)

## 2019-01-28 ENCOUNTER — Ambulatory Visit (INDEPENDENT_AMBULATORY_CARE_PROVIDER_SITE_OTHER): Payer: Managed Care, Other (non HMO) | Admitting: Obstetrics and Gynecology

## 2019-01-28 ENCOUNTER — Other Ambulatory Visit: Payer: Self-pay

## 2019-01-28 VITALS — BP 110/61 | HR 94 | Wt 263.6 lb

## 2019-01-28 DIAGNOSIS — O99210 Obesity complicating pregnancy, unspecified trimester: Secondary | ICD-10-CM

## 2019-01-28 DIAGNOSIS — Z3493 Encounter for supervision of normal pregnancy, unspecified, third trimester: Secondary | ICD-10-CM

## 2019-01-28 DIAGNOSIS — Z113 Encounter for screening for infections with a predominantly sexual mode of transmission: Secondary | ICD-10-CM

## 2019-01-28 DIAGNOSIS — Z3685 Encounter for antenatal screening for Streptococcus B: Secondary | ICD-10-CM

## 2019-01-28 DIAGNOSIS — O99019 Anemia complicating pregnancy, unspecified trimester: Secondary | ICD-10-CM

## 2019-01-28 DIAGNOSIS — O99013 Anemia complicating pregnancy, third trimester: Secondary | ICD-10-CM

## 2019-01-28 LAB — POCT URINALYSIS DIPSTICK OB
Bilirubin, UA: NEGATIVE
Blood, UA: NEGATIVE
Glucose, UA: NEGATIVE
Ketones, UA: NEGATIVE
Leukocytes, UA: NEGATIVE
Nitrite, UA: NEGATIVE
POC,PROTEIN,UA: NEGATIVE
Spec Grav, UA: 1.02 (ref 1.010–1.025)
Urobilinogen, UA: 0.2 E.U./dL
pH, UA: 6 (ref 5.0–8.0)

## 2019-01-28 NOTE — Progress Notes (Signed)
ROB- cultures and anemia panel obtained- labor precautions discussed. covid-hospital restrictions discussed.is open to IOL before 41 weeks if not delivered by then,

## 2019-01-28 NOTE — Progress Notes (Signed)
ROB- cultures obtained, pt is doing well 

## 2019-01-29 ENCOUNTER — Other Ambulatory Visit: Payer: Self-pay | Admitting: Obstetrics and Gynecology

## 2019-01-29 LAB — CBC
Hematocrit: 33.9 % — ABNORMAL LOW (ref 34.0–46.6)
Hemoglobin: 11.4 g/dL (ref 11.1–15.9)
MCH: 27.7 pg (ref 26.6–33.0)
MCHC: 33.6 g/dL (ref 31.5–35.7)
MCV: 82 fL (ref 79–97)
Platelets: 280 10*3/uL (ref 150–450)
RBC: 4.12 x10E6/uL (ref 3.77–5.28)
RDW: 15.3 % (ref 11.7–15.4)
WBC: 9.6 10*3/uL (ref 3.4–10.8)

## 2019-01-29 LAB — B12 AND FOLATE PANEL
Folate: 15.5 ng/mL (ref >3.0)
Vitamin B-12: 276 pg/mL (ref 232–1245)

## 2019-01-29 LAB — FERRITIN: Ferritin: 35 ng/mL (ref 15–150)

## 2019-01-29 LAB — VITAMIN D 25 HYDROXY (VIT D DEFICIENCY, FRACTURES): Vit D, 25-Hydroxy: 27.9 ng/mL — ABNORMAL LOW (ref 30.0–100.0)

## 2019-01-29 MED ORDER — VITAMIN D (ERGOCALCIFEROL) 1.25 MG (50000 UNIT) PO CAPS: 50000.0000 [IU] | ORAL_CAPSULE | ORAL | 6 refills | Status: DC

## 2019-01-30 LAB — STREP GP B NAA: Strep Gp B NAA: NEGATIVE

## 2019-01-31 LAB — GC/CHLAMYDIA PROBE AMP
Chlamydia trachomatis, NAA: NEGATIVE
Neisseria Gonorrhoeae by PCR: NEGATIVE

## 2019-02-02 ENCOUNTER — Telehealth: Payer: Self-pay

## 2019-02-02 NOTE — Telephone Encounter (Signed)
Coronavirus (COVID-19) Are you at risk?  Are you at risk for the Coronavirus (COVID-19)?  To be considered HIGH RISK for Coronavirus (COVID-19), you have to meet the following criteria:  . Traveled to [place], [place], [place], [place] or [place]; or in the [place] to [place], [place], [place], or [place]; and have fever, cough, and shortness of breath within the last 2 weeks of travel OR . Been in close contact with a person diagnosed with COVID-19 within the last 2 weeks and have fever, cough, and shortness of breath . IF YOU DO NOT MEET THESE CRITERIA, YOU ARE CONSIDERED LOW RISK FOR COVID-19.  What to do if you are HIGH RISK for COVID-19?  . If you are having a medical emergency, call 911. . Seek medical care right away. Before you go to a doctor's office, urgent care or emergency department, call ahead and tell them about your recent travel, contact with someone diagnosed with COVID-19, and your symptoms. You should receive instructions from your physician's office regarding next steps of care.  . When you arrive at healthcare provider, tell the healthcare staff immediately you have returned from visiting [place], [place], [place], [place] or [place]; or traveled in the [place] to [place], [place], [place], or [place]; in the last two weeks or you have been in close contact with a person diagnosed with COVID-19 in the last 2 weeks.   . Tell the health care staff about your symptoms: fever, cough and shortness of breath. . After you have been seen by a medical provider, you will be either: o Tested for (COVID-19) and discharged home on quarantine except to seek medical care if symptoms worsen, and asked to  - Stay home and avoid contact with others until you get your results (4-5 days)  - Avoid travel on public transportation if possible (such as bus, train, or airplane) or o Sent to the Emergency Department by EMS for evaluation, COVID-19 testing, and possible  admission depending on your condition and test results.  What to do if you are LOW RISK for COVID-19?  Reduce your risk of any infection by using the same precautions used for avoiding the common cold or flu:  . Wash your hands often with soap and warm water for at least 20 seconds.  If soap and water are not readily available, use an alcohol-based hand sanitizer with at least 60% alcohol.  . If coughing or sneezing, cover your mouth and nose by coughing or sneezing into the elbow areas of your shirt or coat, into a tissue or into your sleeve (not your hands). . Avoid shaking hands with others and consider head nods or verbal greetings only. . Avoid touching your eyes, nose, or mouth with unwashed hands.  . Avoid close contact with people who are Shelia Daniels. . Avoid places or events with large numbers of people in one location, like concerts or sporting events. . Carefully consider travel plans you have or are making. . If you are planning any travel outside or inside the [place], visit the CDC's Travelers' Health webpage for the latest health notices. . If you have some symptoms but not all symptoms, continue to monitor at home and seek medical attention if your symptoms worsen. . If you are having a medical emergency, call 911.  02/02/19 SCREENING NEG SLS ADDITIONAL HEALTHCARE OPTIONS FOR PATIENTS  [facility] / e-Visit: [email or web address]         [facility]: [phone]    [facility]: [phone]                   [facility]: [phone]  

## 2019-02-03 ENCOUNTER — Encounter: Payer: Self-pay | Admitting: Certified Nurse Midwife

## 2019-02-03 ENCOUNTER — Ambulatory Visit (INDEPENDENT_AMBULATORY_CARE_PROVIDER_SITE_OTHER): Payer: Managed Care, Other (non HMO) | Admitting: Certified Nurse Midwife

## 2019-02-03 ENCOUNTER — Other Ambulatory Visit: Payer: Self-pay

## 2019-02-03 VITALS — BP 98/64 | HR 85 | Wt 262.2 lb

## 2019-02-03 DIAGNOSIS — Z3493 Encounter for supervision of normal pregnancy, unspecified, third trimester: Secondary | ICD-10-CM

## 2019-02-03 LAB — POCT URINALYSIS DIPSTICK OB
Bilirubin, UA: NEGATIVE
Blood, UA: NEGATIVE
Glucose, UA: NEGATIVE
Ketones, UA: NEGATIVE
Leukocytes, UA: NEGATIVE
Nitrite, UA: NEGATIVE
POC,PROTEIN,UA: NEGATIVE
Spec Grav, UA: <=1.005 — AB (ref 1.010–1.025)
Urobilinogen, UA: 0.2 E.U./dL
pH, UA: 6.5 (ref 5.0–8.0)

## 2019-02-03 NOTE — Patient Instructions (Signed)
Braxton Hicks Contractions Contractions of the uterus can occur throughout pregnancy, but they are not always a sign that you are in labor. You may have practice contractions called Braxton Hicks contractions. These false labor contractions are sometimes confused with true labor. What are Braxton Hicks contractions? Braxton Hicks contractions are tightening movements that occur in the muscles of the uterus before labor. Unlike true labor contractions, these contractions do not result in opening (dilation) and thinning of the cervix. Toward the end of pregnancy (32-34 weeks), Braxton Hicks contractions can happen more often and may become stronger. These contractions are sometimes difficult to tell apart from true labor because they can be very uncomfortable. You should not feel embarrassed if you go to the hospital with false labor. Sometimes, the only way to tell if you are in true labor is for your health care provider to look for changes in the cervix. The health care provider will do a physical exam and may monitor your contractions. If you are not in true labor, the exam should show that your cervix is not dilating and your water has not broken. If there are no other health problems associated with your pregnancy, it is completely safe for you to be sent home with false labor. You may continue to have Braxton Hicks contractions until you go into true labor. How to tell the difference between true labor and false labor True labor  Contractions last 30-70 seconds.  Contractions become very regular.  Discomfort is usually felt in the top of the uterus, and it spreads to the lower abdomen and low back.  Contractions do not go away with walking.  Contractions usually become more intense and increase in frequency.  The cervix dilates and gets thinner. False labor  Contractions are usually shorter and not as strong as true labor contractions.  Contractions are usually irregular.  Contractions  are often felt in the front of the lower abdomen and in the groin.  Contractions may go away when you walk around or change positions while lying down.  Contractions get weaker and are shorter-lasting as time goes on.  The cervix usually does not dilate or become thin. Follow these instructions at home:   Take over-the-counter and prescription medicines only as told by your health care provider.  Keep up with your usual exercises and follow other instructions from your health care provider.  Eat and drink lightly if you think you are going into labor.  If Braxton Hicks contractions are making you uncomfortable: ? Change your position from lying down or resting to walking, or change from walking to resting. ? Sit and rest in a tub of warm water. ? Drink enough fluid to keep your urine pale yellow. Dehydration may cause these contractions. ? Do slow and deep breathing several times an hour.  Keep all follow-up prenatal visits as told by your health care provider. This is important. Contact a health care provider if:  You have a fever.  You have continuous pain in your abdomen. Get help right away if:  Your contractions become stronger, more regular, and closer together.  You have fluid leaking or gushing from your vagina.  You pass blood-tinged mucus (bloody show).  You have bleeding from your vagina.  You have low back pain that you never had before.  You feel your baby's head pushing down and causing pelvic pressure.  Your baby is not moving inside you as much as it used to. Summary  Contractions that occur before labor are   called Braxton Hicks contractions, false labor, or practice contractions.  Braxton Hicks contractions are usually shorter, weaker, farther apart, and less regular than true labor contractions. True labor contractions usually become progressively stronger and regular, and they become more frequent.  Manage discomfort from Braxton Hicks contractions  by changing position, resting in a warm bath, drinking plenty of water, or practicing deep breathing. This information is not intended to replace advice given to you by your health care provider. Make sure you discuss any questions you have with your health care provider. Document Released: 12/05/2016 Document Revised: 07/04/2017 Document Reviewed: 12/05/2016 Elsevier Patient Education  2020 Elsevier Inc.  

## 2019-02-03 NOTE — Progress Notes (Signed)
ROB doing well. Feels good movement. Labor precautions reviewed. Follow up 1 wk  Jeannelle Wiens, CNM  

## 2019-02-10 NOTE — Progress Notes (Signed)
ROB-Pt present for prenatal care. Pt stated having sharp vaginal pains off and on. Pt stated that she is doing well.

## 2019-02-10 NOTE — Patient Instructions (Addendum)
Vaginal Delivery  Vaginal delivery means that you give birth by pushing your baby out of your birth canal (vagina). A team of health care providers will help you before, during, and after vaginal delivery. Birth experiences are unique for every woman and every pregnancy, and birth experiences vary depending on where you choose to give birth. What happens when I arrive at the birth center or hospital? Once you are in labor and have been admitted into the hospital or birth center, your health care provider may:  Review your pregnancy history and any concerns that you have.  Insert an IV into one of your veins. This may be used to give you fluids and medicines.  Check your blood pressure, pulse, temperature, and heart rate (vital signs).  Check whether your bag of water (amniotic sac) has broken (ruptured).  Talk with you about your birth plan and discuss pain control options. Monitoring Your health care provider may monitor your contractions (uterine monitoring) and your baby's heart rate (fetal monitoring). You may need to be monitored:  Often, but not continuously (intermittently).  All the time or for long periods at a time (continuously). Continuous monitoring may be needed if: ? You are taking certain medicines, such as medicine to relieve pain or make your contractions stronger. ? You have pregnancy or labor complications. Monitoring may be done by:  Placing a special stethoscope or a handheld monitoring device on your abdomen to check your baby's heartbeat and to check for contractions.  Placing monitors on your abdomen (external monitors) to record your baby's heartbeat and the frequency and length of contractions.  Placing monitors inside your uterus through your vagina (internal monitors) to record your baby's heartbeat and the frequency, length, and strength of your contractions. Depending on the type of monitor, it may remain in your uterus or on your baby's head until birth.   Telemetry. This is a type of continuous monitoring that can be done with external or internal monitors. Instead of having to stay in bed, you are able to move around during telemetry. Physical exam Your health care provider may perform frequent physical exams. This may include:  Checking how and where your baby is positioned in your uterus.  Checking your cervix to determine: ? Whether it is thinning out (effacing). ? Whether it is opening up (dilating). What happens during labor and delivery?  Normal labor and delivery is divided into the following three stages: Stage 1  This is the longest stage of labor.  This stage can last for hours or days.  Throughout this stage, you will feel contractions. Contractions generally feel mild, infrequent, and irregular at first. They get stronger, more frequent (about every 2-3 minutes), and more regular as you move through this stage.  This stage ends when your cervix is completely dilated to 4 inches (10 cm) and completely effaced. Stage 2  This stage starts once your cervix is completely effaced and dilated and lasts until the delivery of your baby.  This stage may last from 20 minutes to 2 hours.  This is the stage where you will feel an urge to push your baby out of your vagina.  You may feel stretching and burning pain, especially when the widest part of your baby's head passes through the vaginal opening (crowning).  Once your baby is delivered, the umbilical cord will be clamped and cut. This usually occurs after waiting a period of 1-2 minutes after delivery.  Your baby will be placed on your bare chest (  skin-to-skin contact) in an upright position and covered with a warm blanket. Watch your baby for feeding cues, like rooting or sucking, and help the baby to your breast for his or her first feeding. Stage 3  This stage starts immediately after the birth of your baby and ends after you deliver the placenta.  This stage may take  anywhere from 5 to 30 minutes.  After your baby has been delivered, you will feel contractions as your body expels the placenta and your uterus contracts to control bleeding. What can I expect after labor and delivery?  After labor is over, you and your baby will be monitored closely until you are ready to go home to ensure that you are both healthy. Your health care team will teach you how to care for yourself and your baby.  You and your baby will stay in the same room (rooming in) during your hospital stay. This will encourage early bonding and successful breastfeeding.  You may continue to receive fluids and medicines through an IV.  Your uterus will be checked and massaged regularly (fundal massage).  You will have some soreness and pain in your abdomen, vagina, and the area of skin between your vaginal opening and your anus (perineum).  If an incision was made near your vagina (episiotomy) or if you had some vaginal tearing during delivery, cold compresses may be placed on your episiotomy or your tear. This helps to reduce pain and swelling.  You may be given a squirt bottle to use instead of wiping when you go to the bathroom. To use the squirt bottle, follow these steps: ? Before you urinate, fill the squirt bottle with warm water. Do not use hot water. ? After you urinate, while you are sitting on the toilet, use the squirt bottle to rinse the area around your urethra and vaginal opening. This rinses away any urine and blood. ? Fill the squirt bottle with clean water every time you use the bathroom.  It is normal to have vaginal bleeding after delivery. Wear a sanitary pad for vaginal bleeding and discharge. Summary  Vaginal delivery means that you will give birth by pushing your baby out of your birth canal (vagina).  Your health care provider may monitor your contractions (uterine monitoring) and your baby's heart rate (fetal monitoring).  Your health care provider may perform  a physical exam.  Normal labor and delivery is divided into three stages.  After labor is over, you and your baby will be monitored closely until you are ready to go home. This information is not intended to replace advice given to you by your health care provider. Make sure you discuss any questions you have with your health care provider. Document Released: 04/30/2008 Document Revised: 08/26/2017 Document Reviewed: 08/26/2017 Elsevier Patient Education  2020 Elsevier [employer]. Fetal Movement Counts Patient Name: ________________________________________________ Patient Due Date: ____________________ What is a fetal movement count?  A fetal movement count is the number of times that you feel your baby move during a certain amount of time. This may also be called a fetal kick count. A fetal movement count is recommended for every pregnant woman. You may be asked to start counting fetal movements as early as week 28 of your pregnancy. Pay attention to when your baby is most active. You may notice your baby's sleep and wake cycles. You may also notice things that make your baby move more. You should do a fetal movement count:  When your baby is normally most   active.  At the same time each day. A good time to count movements is while you are resting, after having something to eat and drink. How do I count fetal movements? 1. Find a quiet, comfortable area. Sit, or lie down on your side. 2. Write down the date, the start time and stop time, and the number of movements that you felt between those two times. Take this information with you to your health care visits. 3. For 2 hours, count kicks, flutters, swishes, rolls, and jabs. You should feel at least 10 movements during 2 hours. 4. You may stop counting after you have felt 10 movements. 5. If you do not feel 10 movements in 2 hours, have something to eat and drink. Then, keep resting and counting for 1 hour. If you feel at least 4 movements during  that hour, you may stop counting. Contact a health care provider if:  You feel fewer than 4 movements in 2 hours.  Your baby is not moving like he or she usually does. Date: ____________ Start time: ____________ Stop time: ____________ Movements: ____________ Date: ____________ Start time: ____________ Stop time: ____________ Movements: ____________ Date: ____________ Start time: ____________ Stop time: ____________ Movements: ____________ Date: ____________ Start time: ____________ Stop time: ____________ Movements: ____________ Date: ____________ Start time: ____________ Stop time: ____________ Movements: ____________ Date: ____________ Start time: ____________ Stop time: ____________ Movements: ____________ Date: ____________ Start time: ____________ Stop time: ____________ Movements: ____________ Date: ____________ Start time: ____________ Stop time: ____________ Movements: ____________ Date: ____________ Start time: ____________ Stop time: ____________ Movements: ____________ This information is not intended to replace advice given to you by your health care provider. Make sure you discuss any questions you have with your health care provider. Document Released: 08/21/2006 Document Revised: 08/11/2018 Document Reviewed: 08/31/2015 Elsevier Patient Education  2020 Elsevier Inc.  

## 2019-02-11 ENCOUNTER — Other Ambulatory Visit: Payer: Self-pay

## 2019-02-11 ENCOUNTER — Ambulatory Visit (INDEPENDENT_AMBULATORY_CARE_PROVIDER_SITE_OTHER): Payer: Managed Care, Other (non HMO) | Admitting: Certified Nurse Midwife

## 2019-02-11 ENCOUNTER — Encounter: Payer: Self-pay | Admitting: Certified Nurse Midwife

## 2019-02-11 VITALS — BP 117/82 | HR 99 | Wt 263.2 lb

## 2019-02-11 DIAGNOSIS — Z3493 Encounter for supervision of normal pregnancy, unspecified, third trimester: Secondary | ICD-10-CM

## 2019-02-11 LAB — POCT URINALYSIS DIPSTICK OB
Bilirubin, UA: NEGATIVE
Ketones, UA: NEGATIVE
Leukocytes, UA: NEGATIVE
Nitrite, UA: NEGATIVE
Spec Grav, UA: 1.02 (ref 1.010–1.025)
Urobilinogen, UA: 0.2 E.U./dL
pH, UA: 7 (ref 5.0–8.0)

## 2019-02-11 NOTE — Progress Notes (Signed)
ROB-Reports intermittent sharp vaginal pains. Requests SVE. Discussed "lightening crotch" and home treatment measures. Education regarding home labor preparation techniques. Declines 39 wk IOL due to Body mass index is 42.48 kg/m. Open to IOL after due date. Encouraged twice daily kick counts. Reviewed red flag symptoms and when to call. RTC x 1 week for ROB or sooner if needed.

## 2019-02-17 ENCOUNTER — Ambulatory Visit (INDEPENDENT_AMBULATORY_CARE_PROVIDER_SITE_OTHER): Payer: Managed Care, Other (non HMO) | Admitting: Obstetrics and Gynecology

## 2019-02-17 ENCOUNTER — Other Ambulatory Visit: Payer: Self-pay

## 2019-02-17 VITALS — BP 100/74 | HR 85 | Wt 266.4 lb

## 2019-02-17 DIAGNOSIS — Z3493 Encounter for supervision of normal pregnancy, unspecified, third trimester: Secondary | ICD-10-CM

## 2019-02-17 LAB — POCT URINALYSIS DIPSTICK OB
Bilirubin, UA: NEGATIVE
Blood, UA: NEGATIVE
Glucose, UA: NEGATIVE
Ketones, UA: NEGATIVE
Leukocytes, UA: NEGATIVE
Nitrite, UA: NEGATIVE
POC,PROTEIN,UA: NEGATIVE
Spec Grav, UA: 1.015 (ref 1.010–1.025)
Urobilinogen, UA: 0.2 E.U./dL
pH, UA: 6.5 (ref 5.0–8.0)

## 2019-02-17 NOTE — Progress Notes (Signed)
ROB-declined pelvic, discussed IOL on 7/21 at 8am. Will see Sharyn Lull day before for NST

## 2019-02-17 NOTE — Progress Notes (Signed)
ROB- pt is having some pelvic pressure 

## 2019-02-19 ENCOUNTER — Ambulatory Visit: Payer: Managed Care, Other (non HMO) | Attending: Certified Nurse Midwife

## 2019-02-22 ENCOUNTER — Ambulatory Visit (INDEPENDENT_AMBULATORY_CARE_PROVIDER_SITE_OTHER): Payer: Managed Care, Other (non HMO) | Admitting: Certified Nurse Midwife

## 2019-02-22 ENCOUNTER — Other Ambulatory Visit: Payer: Managed Care, Other (non HMO)

## 2019-02-22 ENCOUNTER — Other Ambulatory Visit: Payer: Self-pay

## 2019-02-22 VITALS — BP 122/71 | HR 90 | Wt 267.9 lb

## 2019-02-22 DIAGNOSIS — O99213 Obesity complicating pregnancy, third trimester: Secondary | ICD-10-CM

## 2019-02-22 DIAGNOSIS — Z3493 Encounter for supervision of normal pregnancy, unspecified, third trimester: Secondary | ICD-10-CM

## 2019-02-22 LAB — POCT URINALYSIS DIPSTICK OB
Bilirubin, UA: NEGATIVE
Blood, UA: NEGATIVE
Glucose, UA: NEGATIVE
Ketones, UA: NEGATIVE
Leukocytes, UA: NEGATIVE
Nitrite, UA: NEGATIVE
POC,PROTEIN,UA: NEGATIVE
Spec Grav, UA: 1.015 (ref 1.010–1.025)
Urobilinogen, UA: 0.2 E.U./dL
pH, UA: 7 (ref 5.0–8.0)

## 2019-02-22 NOTE — Progress Notes (Signed)
NST & ROB-Patient c/o intermittent pelvic/ vaginal pain. SVE unchanged from previous exam. Discussed labor induction methods. Reviewed red flag symptoms and when to call. RTC x 7 weeks for PPV or sooner if needed. Report to birthing suites in AM for IOL or sooner if needed.   NONSTRESS TEST INTERPRETATION  INDICATIONS: Obesity  FHR baseline: 125-130 bpm RESULTS:Reactive COMMENTS: Occasional contractions   PLAN: 1. Continue fetal kick counts twice a day. 2. Report to birthing suites for IOL or sooner if needed   Diona Fanti, CNM [facility], [facility] 02/22/19 4:05 PM

## 2019-02-22 NOTE — Patient Instructions (Signed)

## 2019-02-23 ENCOUNTER — Encounter: Payer: Self-pay | Admitting: Certified Nurse Midwife

## 2019-02-23 ENCOUNTER — Inpatient Hospital Stay: Payer: 59 | Admitting: Anesthesiology

## 2019-02-23 ENCOUNTER — Other Ambulatory Visit: Payer: Self-pay

## 2019-02-23 ENCOUNTER — Inpatient Hospital Stay
Admission: RE | Admit: 2019-02-23 | Discharge: 2019-02-25 | DRG: 768 | Disposition: A | Discharge status: Home / Self Care | Payer: 59 | Attending: Certified Nurse Midwife | Admitting: Certified Nurse Midwife

## 2019-02-23 DIAGNOSIS — Z3A40 40 weeks gestation of pregnancy: Secondary | ICD-10-CM | POA: Diagnosis not present

## 2019-02-23 DIAGNOSIS — Z1159 Encounter for screening for other viral diseases: Secondary | ICD-10-CM | POA: Diagnosis not present

## 2019-02-23 DIAGNOSIS — O480 Post-term pregnancy: Secondary | ICD-10-CM | POA: Diagnosis present

## 2019-02-23 DIAGNOSIS — O9902 Anemia complicating childbirth: Secondary | ICD-10-CM | POA: Diagnosis present

## 2019-02-23 DIAGNOSIS — O99214 Obesity complicating childbirth: Secondary | ICD-10-CM | POA: Diagnosis present

## 2019-02-23 DIAGNOSIS — O99013 Anemia complicating pregnancy, third trimester: Secondary | ICD-10-CM

## 2019-02-23 DIAGNOSIS — D649 Anemia, unspecified: Secondary | ICD-10-CM | POA: Diagnosis present

## 2019-02-23 DIAGNOSIS — R739 Hyperglycemia, unspecified: Secondary | ICD-10-CM

## 2019-02-23 DIAGNOSIS — Z3490 Encounter for supervision of normal pregnancy, unspecified, unspecified trimester: Secondary | ICD-10-CM | POA: Diagnosis present

## 2019-02-23 LAB — CBC
HCT: 35.2 % — ABNORMAL LOW (ref 36.0–46.0)
Hemoglobin: 11.8 g/dL — ABNORMAL LOW (ref 12.0–15.0)
MCH: 28.2 pg (ref 26.0–34.0)
MCHC: 33.5 g/dL (ref 30.0–36.0)
MCV: 84.2 fL (ref 80.0–100.0)
Platelets: 261 10*3/uL (ref 150–400)
RBC: 4.18 MIL/uL (ref 3.87–5.11)
RDW: 16.1 % — ABNORMAL HIGH (ref 11.5–15.5)
WBC: 9.9 10*3/uL (ref 4.0–10.5)
nRBC: 0 % (ref 0.0–0.2)

## 2019-02-23 LAB — TYPE AND SCREEN
ABO/RH(D): O POS
Antibody Screen: NEGATIVE

## 2019-02-23 LAB — SARS CORONAVIRUS 2 BY RT PCR (HOSPITAL ORDER, PERFORMED IN ~~LOC~~ HOSPITAL LAB): SARS Coronavirus 2: NEGATIVE

## 2019-02-23 MED ORDER — OXYCODONE-ACETAMINOPHEN 5-325 MG PO TABS: 1.0000 | ORAL_TABLET | ORAL | Status: DC | PRN

## 2019-02-23 MED ORDER — MISOPROSTOL 50MCG HALF TABLET: 50.0000 ug | ORAL_TABLET | ORAL | Status: DC

## 2019-02-23 MED ORDER — FENTANYL 2.5 MCG/ML W/ROPIVACAINE 0.15% IN NS 100 ML EPIDURAL (ARMC)
EPIDURAL | Status: AC
  Filled 2019-02-23: qty 100

## 2019-02-23 MED ORDER — OXYTOCIN 40 UNITS IN NORMAL SALINE INFUSION - SIMPLE MED
1.0000 m[IU]/min | INTRAVENOUS | Status: DC
  Administered 2019-02-23: 1 m[IU]/min via INTRAVENOUS
  Filled 2019-02-23: qty 1000

## 2019-02-23 MED ORDER — ONDANSETRON HCL 4 MG/2ML IJ SOLN: 4.0000 mg | Freq: Four times a day (QID) | INTRAMUSCULAR | Status: DC | PRN

## 2019-02-23 MED ORDER — OXYTOCIN BOLUS FROM INFUSION: 500.0000 mL | Freq: Once | INTRAVENOUS | Status: DC

## 2019-02-23 MED ORDER — LACTATED RINGERS IV SOLN: 500.0000 mL | INTRAVENOUS | Status: DC | PRN

## 2019-02-23 MED ORDER — TERBUTALINE SULFATE 1 MG/ML IJ SOLN: 0.2500 mg | Freq: Once | INTRAMUSCULAR | Status: DC | PRN

## 2019-02-23 MED ORDER — OXYCODONE-ACETAMINOPHEN 5-325 MG PO TABS: 2.0000 | ORAL_TABLET | ORAL | Status: DC | PRN

## 2019-02-23 MED ORDER — MISOPROSTOL 50MCG HALF TABLET
50.0000 ug | ORAL_TABLET | ORAL | Status: DC
  Administered 2019-02-23 (×2): 50 ug via VAGINAL
  Filled 2019-02-23: qty 1

## 2019-02-23 MED ORDER — LIDOCAINE HCL (PF) 1 % IJ SOLN
30.0000 mL | INTRAMUSCULAR | Status: AC | PRN
  Administered 2019-02-24: 05:00:00 30 mL via SUBCUTANEOUS

## 2019-02-23 MED ORDER — OXYTOCIN 40 UNITS IN NORMAL SALINE INFUSION - SIMPLE MED: 2.5000 [IU]/h | INTRAVENOUS | Status: DC

## 2019-02-23 MED ORDER — BUTORPHANOL TARTRATE 2 MG/ML IJ SOLN
1.0000 mg | INTRAMUSCULAR | Status: DC | PRN
  Administered 2019-02-23: 1 mg via INTRAVENOUS
  Filled 2019-02-23: qty 1

## 2019-02-23 MED ORDER — ZOLPIDEM TARTRATE 5 MG PO TABS: 5.0000 mg | ORAL_TABLET | Freq: Every evening | ORAL | Status: DC | PRN

## 2019-02-23 MED ORDER — ACETAMINOPHEN 325 MG PO TABS: 650.0000 mg | ORAL_TABLET | ORAL | Status: DC | PRN

## 2019-02-23 MED ORDER — LACTATED RINGERS IV SOLN
INTRAVENOUS | Status: DC
  Administered 2019-02-23: 20:00:00 via INTRAVENOUS

## 2019-02-23 MED ORDER — MISOPROSTOL 50MCG HALF TABLET
50.0000 ug | ORAL_TABLET | ORAL | Status: DC
  Administered 2019-02-23: 50 ug via ORAL
  Filled 2019-02-23: qty 1

## 2019-02-23 MED ORDER — SOD CITRATE-CITRIC ACID 500-334 MG/5ML PO SOLN: 30.0000 mL | ORAL | Status: DC | PRN

## 2019-02-23 MED ORDER — SODIUM CHLORIDE 0.9% FLUSH: 3.0000 mL | Freq: Two times a day (BID) | INTRAVENOUS | Status: DC

## 2019-02-23 NOTE — Progress Notes (Signed)
Patient ID: Shelia Daniels, female   DOB: [birth year], 33 y.o.   MRN: [id number]  Shelia Daniels is a 33 y.o. G1P0000 at [redacted]w[redacted]d by LMP admitted for induction of labor due to Post dates. Due date 02/23/2019 and Morbid obesity.  Subjective:  Patient sitting in bed, watching television. FOB at bedside for continuous labor support.   Denies difficulty breathing or respiratory distress, chest pain, abdominal pain, vaginal bleeding, dysuria, and leg pain or swelling.   Objective:  Temp:  [98 F (36.7 C)] 98 F (36.7 C) (07/21 0904) Pulse Rate:  [80-86] 84 (07/21 1745) Resp:  [20] 20 (07/21 0904) BP: (120-126)/(62-83) 120/68 (07/21 1745) Weight:  [120.7 kg] 120.7 kg (07/21 0915)  Fetal Wellbeing:  Category I  UC:   regular, every two (2) to three (3) minutes; soft resting tone  SVE:   Dilation: 3.5 Effacement (%): 60 Station: -2 Exam by:: Dani Gobble, CNM  Labs: Lab Results  Component Value Date   WBC 9.9 02/23/2019   HGB 11.8 (L) 02/23/2019   HCT 35.2 (L) 02/23/2019   MCV 84.2 02/23/2019   PLT 261 02/23/2019    Assessment:  Shelia Daniels is a 33 y.o. G1P0000 at [redacted]w[redacted]d being admitted for induction of labor due to maternal obesity, Rh positive, GBS negative  FHR Category I  Plan:  Encouraged position change and use of peanut ball.   Reviewed red flag symptoms and when to call.   Continue orders as written. Reassess as needed.    Diona Fanti, CNM [facility], [facility] 02/23/2019, 6:51 PM

## 2019-02-23 NOTE — H&P (Signed)
Obstetric History and Physical  Shelia Daniels is a 33 y.o. G1P0000 with IUP at 51w1dpresenting for induction of labor due to maternal obesity.   Patient states she has been having  none contractions, none vaginal bleeding, intact membranes, with active fetal movement.    Denies difficulty breathing or respiratory distress, chest pain, abdominal pain, dysuria, and leg pain or swelling.   Prenatal Course  Source of Care: [facility]-initial visit at 7 wks, total visits: 13  Pregnancy complications or risks: Obesity in pregnancy, Anemia in pregnancy, Abnormal glucola with normal GTT  Prenatal labs and studies:  ABO, Rh: O/Positive/-- (2024/12/161549)  Antibody: Negative (Dec 16, 20241549)  Rubella: 1.51 (12-16-241549)  RPR: Non Reactive (05/15 1109)   HBsAg: Negative (2024-12-161549)   HIV: Non Reactive (16-Dec-20241549)   GQMG:NOIBBCWU(06/25 1617)  1 hr Glucola: 100 (02/04 0927), 143 (05/15 1109)  Gestational Glucose Tolerance: 91, 144, 133, 100 (05/21 0801)  Genetic screening: Low risk female (01/06  1045)  Anatomy [place] Complete, normal (03/26 1412)  Past Medical History:  Diagnosis Date  . Family history of breast cancer   . Genetic testing 12/2015   My Risk/BRCA neg  . Increased risk of breast cancer 12/2015   IBIS=21.7%    Past Surgical History:  Procedure Laterality Date  . INTRAUTERINE DEVICE (IUD) INSERTION  05/2014   Mirena    OB History  Gravida Para Term Preterm AB Living  1 0 0 0 0 0  SAB TAB Ectopic Multiple Live Births  0 0 0 0 0    # Outcome Date GA Lbr Len/2nd Weight Sex Delivery Anes PTL Lv  1 Current             Social History   Socioeconomic History  . Marital status: Married    Spouse name: Not on file  . Number of children: Not on file  . Years of education: Not on file  . Highest education level: Not on file  Occupational History  . Not on file  Social Needs  . Financial resource strain: Not on file  . Food insecurity    Worry: Not on file   Inability: Not on file  . Transportation needs    Medical: Not on file    Non-medical: Not on file  Tobacco Use  . Smoking status: Never Smoker  . Smokeless tobacco: Never Used  Substance and Sexual Activity  . Alcohol use: Not Currently    Alcohol/week: 2.0 standard drinks    Types: 2 Standard drinks or equivalent per week  . Drug use: No  . Sexual activity: Yes    Birth control/protection: None  Lifestyle  . Physical activity    Days per week: Not on file    Minutes per session: Not on file  . Stress: Not on file  Relationships  . Social [place] phone: Never    Gets together: Never    Attends religious service: Never    Active member of club or organization: Yes    Attends meetings of clubs or organizations: 1 to 4 times per year    Relationship status: Married  Other Topics Concern  . Not on file  Social History Narrative  . Not on file    Family History  Problem Relation Age of Onset  . Breast cancer Paternal Grandmother 442 . Pancreatic cancer Paternal Grandmother   . Diabetes Paternal Grandmother   . Diabetes Mother   . Hyperlipidemia Mother   .  Hypertension Mother   . Diabetes Father   . Heart attack Father   . Hyperlipidemia Maternal Uncle   . Hypertension Maternal Uncle   . Hyperlipidemia Maternal Grandfather   . Hypertension Maternal Grandfather   . Lymphoma Paternal Grandfather   . Ovarian cancer Neg Hx   . Colon cancer Neg Hx     Medications Prior to Admission  Medication Sig Dispense Refill Last Dose  . docusate sodium (COLACE) 100 MG capsule Take 100 mg by mouth daily.     . Ferrous Sulfate (IRON) 325 (65 Fe) MG TABS Take 1 tablet by mouth daily.     . fluticasone (FLONASE) 50 MCG/ACT nasal spray Place 2 sprays into both nostrils as needed for allergies or rhinitis.     Marland Kitchen loratadine (CLARITIN) 10 MG tablet Take 10 mg by mouth daily.     . Prenatal MV-Min-Fe Fum-FA-DHA (PRENATAL+DHA PO)      . Vitamin D, Ergocalciferol, (DRISDOL)  1.25 MG (50000 UT) CAPS capsule Take 1 capsule (50,000 Units total) by mouth every 7 (seven) days. 90 capsule 6     Allergies  Allergen Reactions  . Pollen Extract Other (See Comments)    Sneezing, watery eyes, seasonal    Review of Systems: Negative except for what is mentioned in HPI.  Physical Exam:  BP 124/62 (BP Location: Right Arm)   Pulse 80   Temp 98 F (36.7 C) (Oral)   Resp 20   LMP 05/18/2018 (Exact Date)   GENERAL: Well-developed, well-nourished female in no acute distress.   LUNGS: Clear to auscultation bilaterally.   HEART: Regular rate and rhythm.  ABDOMEN: Soft, nontender, nondistended, gravid.  EXTREMITIES: Nontender, no edema, 2+ distal pulses.  Cervical Exam: Dilation: 1 Effacement (%): 40 Cervical Position: Posterior Station: -3 Presentation: Vertex Exam by:: Diego Cory, CNM  FHT:  Baseline rate 145 bpm   Variability moderate  Accelerations present   Decelerations none  Contractions: Irregular contractions, soft resting tone   Pertinent Labs/Studies:    Results for orders placed or performed in visit on 02/22/19 (from the past 24 hour(s))  POC Urinalysis Dipstick OB     Status: None   Collection Time: 02/22/19  2:29 PM  Result Value Ref Range   Color, UA Yellow    Clarity, UA Clear    Glucose, UA Negative Negative   Bilirubin, UA Negative    Ketones, UA Negative    Spec Grav, UA 1.015 1.010 - 1.025   Blood, UA Negative    pH, UA 7.0 5.0 - 8.0   POC,PROTEIN,UA Negative Negative, Trace, Small (1+), Moderate (2+), Large (3+), 4+   Urobilinogen, UA 0.2 0.2 or 1.0 E.U./dL   Nitrite, UA Negative    Leukocytes, UA Negative Negative   Appearance     Odor     Assessment :  Shelia Daniels is a 33 y.o. G1P0000 at 70w1dbeing admitted for induction of labor due to maternal obesity, Rh positive, GBS negative  FHR Category I  Plan:  Admit to birthing suites, see orders  Induction/Augmentation as needed, per protocol  Delivery plan:  Hopeful for vaginal delivery  Dr CMarcelline Matesnotified of admission and plan of care   JDiona Fanti CNM [facility], [facility]07/21/20 9:12 AM

## 2019-02-24 DIAGNOSIS — O99214 Obesity complicating childbirth: Principal | ICD-10-CM

## 2019-02-24 LAB — CBC
HCT: 29.1 % — ABNORMAL LOW (ref 36.0–46.0)
Hemoglobin: 9.7 g/dL — ABNORMAL LOW (ref 12.0–15.0)
MCH: 28.2 pg (ref 26.0–34.0)
MCHC: 33.3 g/dL (ref 30.0–36.0)
MCV: 84.6 fL (ref 80.0–100.0)
Platelets: 232 10*3/uL (ref 150–400)
RBC: 3.44 MIL/uL — ABNORMAL LOW (ref 3.87–5.11)
RDW: 16.5 % — ABNORMAL HIGH (ref 11.5–15.5)
WBC: 13.9 10*3/uL — ABNORMAL HIGH (ref 4.0–10.5)
nRBC: 0 % (ref 0.0–0.2)

## 2019-02-24 LAB — RPR: RPR Ser Ql: NONREACTIVE

## 2019-02-24 MED ORDER — SODIUM CHLORIDE 0.9% FLUSH: 3.0000 mL | INTRAVENOUS | Status: DC | PRN

## 2019-02-24 MED ORDER — BENZOCAINE-MENTHOL 20-0.5 % EX AERO
1.0000 "application " | INHALATION_SPRAY | CUTANEOUS | Status: DC | PRN
  Administered 2019-02-24: 1 via TOPICAL
  Filled 2019-02-24: qty 56

## 2019-02-24 MED ORDER — METHYLERGONOVINE MALEATE 0.2 MG/ML IJ SOLN: 0.2000 mg | INTRAMUSCULAR | Status: DC | PRN

## 2019-02-24 MED ORDER — EPHEDRINE 5 MG/ML INJ: 10.0000 mg | INTRAVENOUS | Status: DC | PRN

## 2019-02-24 MED ORDER — COCONUT OIL OIL
1.0000 "application " | TOPICAL_OIL | Status: DC | PRN
  Administered 2019-02-25: 1 via TOPICAL
  Filled 2019-02-24: qty 120

## 2019-02-24 MED ORDER — SIMETHICONE 80 MG PO CHEW: 80.0000 mg | CHEWABLE_TABLET | ORAL | Status: DC | PRN

## 2019-02-24 MED ORDER — LACTATED RINGERS AMNIOINFUSION
INTRAVENOUS | Status: DC
  Administered 2019-02-24: 02:00:00 via INTRAUTERINE
  Filled 2019-02-24: qty 1000

## 2019-02-24 MED ORDER — FERROUS SULFATE 325 (65 FE) MG PO TABS
325.0000 mg | ORAL_TABLET | Freq: Every day | ORAL | Status: DC
  Administered 2019-02-24 – 2019-02-25 (×2): 325 mg via ORAL
  Filled 2019-02-24 (×2): qty 1

## 2019-02-24 MED ORDER — PHENYLEPHRINE 40 MCG/ML (10ML) SYRINGE FOR IV PUSH (FOR BLOOD PRESSURE SUPPORT): 80.0000 ug | PREFILLED_SYRINGE | INTRAVENOUS | Status: DC | PRN

## 2019-02-24 MED ORDER — OXYTOCIN 10 UNIT/ML IJ SOLN
INTRAMUSCULAR | Status: AC
  Administered 2019-02-24: 10 [IU]
  Filled 2019-02-24: qty 2

## 2019-02-24 MED ORDER — PRENATAL MULTIVITAMIN CH
1.0000 | ORAL_TABLET | Freq: Every day | ORAL | Status: DC
  Administered 2019-02-24 – 2019-02-25 (×2): 1 via ORAL
  Filled 2019-02-24 (×2): qty 1

## 2019-02-24 MED ORDER — MISOPROSTOL 200 MCG PO TABS
ORAL_TABLET | ORAL | Status: AC
  Filled 2019-02-24: qty 4

## 2019-02-24 MED ORDER — LIDOCAINE HCL (PF) 1 % IJ SOLN
INTRAMUSCULAR | Status: AC
  Filled 2019-02-24: qty 30

## 2019-02-24 MED ORDER — SENNOSIDES-DOCUSATE SODIUM 8.6-50 MG PO TABS
2.0000 | ORAL_TABLET | ORAL | Status: DC
  Administered 2019-02-24: 23:00:00 2 via ORAL
  Filled 2019-02-24: qty 2

## 2019-02-24 MED ORDER — SODIUM CHLORIDE 0.9 % IV SOLN
INTRAVENOUS | Status: DC | PRN
  Administered 2019-02-24 (×3): 5 mL via EPIDURAL

## 2019-02-24 MED ORDER — IBUPROFEN 600 MG PO TABS
600.0000 mg | ORAL_TABLET | Freq: Four times a day (QID) | ORAL | Status: DC
  Administered 2019-02-24 – 2019-02-25 (×6): 600 mg via ORAL
  Filled 2019-02-24 (×6): qty 1

## 2019-02-24 MED ORDER — DIBUCAINE (PERIANAL) 1 % EX OINT: 1.0000 "application " | TOPICAL_OINTMENT | CUTANEOUS | Status: DC | PRN

## 2019-02-24 MED ORDER — ONDANSETRON HCL 4 MG PO TABS: 4.0000 mg | ORAL_TABLET | ORAL | Status: DC | PRN

## 2019-02-24 MED ORDER — LIDOCAINE HCL (PF) 1 % IJ SOLN
INTRAMUSCULAR | Status: DC | PRN
  Administered 2019-02-23 (×2): 1 mL

## 2019-02-24 MED ORDER — LACTATED RINGERS IV SOLN: 500.0000 mL | Freq: Once | INTRAVENOUS | Status: DC

## 2019-02-24 MED ORDER — FENTANYL 2.5 MCG/ML W/ROPIVACAINE 0.15% IN NS 100 ML EPIDURAL (ARMC)
EPIDURAL | Status: DC | PRN
  Administered 2019-02-24: 12 mL/h via EPIDURAL

## 2019-02-24 MED ORDER — METHYLERGONOVINE MALEATE 0.2 MG PO TABS
0.2000 mg | ORAL_TABLET | ORAL | Status: DC | PRN
  Administered 2019-02-24: 0.2 mg via ORAL
  Filled 2019-02-24: qty 1

## 2019-02-24 MED ORDER — WITCH HAZEL-GLYCERIN EX PADS: 1.0000 "application " | MEDICATED_PAD | CUTANEOUS | Status: DC | PRN

## 2019-02-24 MED ORDER — AMMONIA AROMATIC IN INHA
RESPIRATORY_TRACT | Status: AC
  Filled 2019-02-24: qty 10

## 2019-02-24 MED ORDER — DIPHENHYDRAMINE HCL 25 MG PO CAPS: 25.0000 mg | ORAL_CAPSULE | Freq: Four times a day (QID) | ORAL | Status: DC | PRN

## 2019-02-24 MED ORDER — ACETAMINOPHEN 325 MG PO TABS
650.0000 mg | ORAL_TABLET | ORAL | Status: DC | PRN
  Administered 2019-02-24 – 2019-02-25 (×2): 650 mg via ORAL
  Filled 2019-02-24 (×2): qty 2

## 2019-02-24 MED ORDER — FENTANYL 2.5 MCG/ML W/ROPIVACAINE 0.15% IN NS 100 ML EPIDURAL (ARMC)
12.0000 mL/h | EPIDURAL | Status: DC
  Filled 2019-02-24: qty 100

## 2019-02-24 MED ORDER — DIPHENHYDRAMINE HCL 50 MG/ML IJ SOLN: 12.5000 mg | INTRAMUSCULAR | Status: DC | PRN

## 2019-02-24 MED ORDER — SODIUM CHLORIDE 0.9 % IV SOLN: 250.0000 mL | INTRAVENOUS | Status: DC | PRN

## 2019-02-24 MED ORDER — ONDANSETRON HCL 4 MG/2ML IJ SOLN: 4.0000 mg | INTRAMUSCULAR | Status: DC | PRN

## 2019-02-24 MED ORDER — SODIUM CHLORIDE 0.9% FLUSH: 3.0000 mL | Freq: Two times a day (BID) | INTRAVENOUS | Status: DC

## 2019-02-24 NOTE — Progress Notes (Signed)
Patient ID: Shelia Daniels, female   DOB: 1985/11/29, 33 y.o.   MRN: [id number]  Shelia Daniels is a 33 y.o. G1P0000 at [redacted]w[redacted]d by LMP admitted for induction of labor due to Post dates. Due date 02/23/2019 and morbid obesity.  Subjective:  Reports pelvic pressure with contractions, notes slight relief after epidural placement. FOB at bedside for continuous labor support.   Denies difficulty breathing or respiratory distress, chest pain, abdominal pain, dysuria, and leg pain or swelling.   Objective:  Temp:  [98 F (36.7 C)-98.8 F (37.1 C)] 98.4 F (36.9 C) (07/21 2323) Pulse Rate:  [80-92] 89 (07/22 0100) Resp:  [19-20] 19 (07/22 0006) BP: (103-149)/(60-91) 121/68 (07/22 0100) SpO2:  [99 %-100 %] 100 % (07/22 0100) Weight:  [120.7 kg] 120.7 kg (07/21 0915)  Fetal Wellbeing:  Category II  UC:   regular, every one (1) to four (4) minutes; soft resting  SVE:   Dilation: 7 Effacement (%): 90 Station: -1 Exam by:: A. White, RN  Labs: Lab Results  Component Value Date   WBC 9.9 02/23/2019   HGB 11.8 (L) 02/23/2019   HCT 35.2 (L) 02/23/2019   MCV 84.2 02/23/2019   PLT 261 02/23/2019    Assessment:  Shelia Poinsettis a 33 y.o.G1P0000 at [redacted]w[redacted]d being admitted for induction oflabor due to maternal obesity, Rh positive, GBS negative  FHR Category II  Plan:  IUPC placed without difficulty. Will start amnioinfusion, see orders.   Pitocin to remain off.   Reviewed red flag symptoms and when to call.   Continue orders as written. Reassess as needed.    Shelia Daniels, CNM [facility], [facility] 02/24/2019, 1:17 AM

## 2019-02-24 NOTE — Anesthesia Preprocedure Evaluation (Signed)
Anesthesia Evaluation  Patient identified by MRN, date of birth, ID band Patient awake    Reviewed: Allergy & Precautions, H&P , NPO status , Patient's Chart, lab work & pertinent test results  Airway Mallampati: III  TM Distance: >3 FB Neck ROM: full    Dental  (+) Teeth Intact   Pulmonary neg pulmonary ROS,           Cardiovascular Exercise Tolerance: Good (-) hypertensionnegative cardio ROS       Neuro/Psych    GI/Hepatic negative GI ROS,   Endo/Other  Morbid obesity  Renal/GU   negative genitourinary   Musculoskeletal   Abdominal   Peds  Hematology  (+) Blood dyscrasia, anemia ,   Anesthesia Other Findings Past Medical History: No date: Family history of breast cancer 12/2015: Genetic testing     Comment:  My Risk/BRCA neg 12/2015: Increased risk of breast cancer     Comment:  IBIS=21.7%  Past Surgical History: 05/2014: INTRAUTERINE DEVICE (IUD) INSERTION     Comment:  Mirena  BMI    Body Mass Index: 42.93 kg/m      Reproductive/Obstetrics (+) Pregnancy                             Anesthesia Physical Anesthesia Plan  ASA: III  Anesthesia Plan: Epidural   Post-op Pain Management:    Induction:   PONV Risk Score and Plan:   Airway Management Planned:   Additional Equipment:   Intra-op Plan:   Post-operative Plan:   Informed Consent: I have reviewed the patients History and Physical, chart, labs and discussed the procedure including the risks, benefits and alternatives for the proposed anesthesia with the patient or authorized representative who has indicated his/her understanding and acceptance.       Plan Discussed with: Anesthesiologist  Anesthesia Plan Comments:         Anesthesia Quick Evaluation

## 2019-02-24 NOTE — Lactation Note (Signed)
This note was copied from a baby's chart. Lactation Consultation Note  Patient Name: Shelia Daniels Today's Date: 02/24/2019 Reason for consult: Follow-up assessment  Mom reported that shortly after last assisted feed infant did start to gag, and had a significant amount of mucous to come up.   Mom had baby placed in skin to skin position in efforts to wake baby and encourage a feeding.  LC spoke with parents regarding timing of 24 hour growth spurt that could happen over night. Signs of early feeding cues, what to look for in a good latch: wide open mouth, flange lips, and anticipation of cluster feeding tonight.  Reviewed infant feeding behaviors, newborn stomach size, and expectations/goals for wet/stool diapers on infants age.  Maternal Data    Feeding Feeding Type: Breast Fed  LATCH Score Latch: Grasps breast easily, tongue down, lips flanged, rhythmical sucking.  Audible Swallowing: A few with stimulation  Type of Nipple: Everted at rest and after stimulation  Comfort (Breast/Nipple): Soft / non-tender  Hold (Positioning): Assistance needed to correctly position infant at breast and maintain latch.  LATCH Score: 8  Interventions Interventions: Position options;Support pillows;Adjust position  Lactation Tools Discussed/Used     Consult Status Consult Status: Follow-up Date: 02/24/19 Follow-up type: In-patient    [name] 02/24/2019, 4:38 PM

## 2019-02-24 NOTE — Lactation Note (Signed)
This note was copied from a baby's chart. Lactation Consultation Note  Patient Name: [name] Today's Date: 02/24/2019   [facility] spoke with mom this morning. Mom reports breastfeeding 2 times before coming out to the [facility]. She states that breastfeeding went well in the back, but she has tried to breastfeed the last 20 minutes and can't get the baby interested; infant falling asleep when placed skin to skin.  Reviewed with mom typical feeding of newborns in the first 24 hours, early hunger cues, signs of fullness. Provided information on minimum expectations of wet/stool diapers for first 24 hours. Reviewed infants stomach size, colostrum and timing of transition to mature milk.  Encouraged mom to continue with skin to skin, monitoring hunger cues, feeding on cues.  Mom plans to call out to [name] for observation and assistance with next feed. Name and number written on the white board.  Maternal Data Does the patient have breastfeeding experience prior to this delivery?: No  Feeding    LATCH Score                   Interventions    Lactation Tools Discussed/Used     Consult Status      [name] 02/24/2019, 9:38 AM

## 2019-02-24 NOTE — Anesthesia Procedure Notes (Signed)
Epidural Patient location during procedure: OB Start time: 02/23/2019 11:46 PM End time: 02/24/2019 12:11 AM  Staffing Anesthesiologist: Durenda Hurt, MD Performed: anesthesiologist   Preanesthetic Checklist Completed: patient identified, site marked, surgical consent, pre-op evaluation, timeout performed, IV checked, risks and benefits discussed and monitors and equipment checked  Epidural Patient position: sitting Prep: ChloraPrep Patient monitoring: heart rate, continuous pulse ox and blood pressure Approach: midline Location: L4-L5 Injection technique: LOR saline  Needle:  Needle type: Tuohy  Needle gauge: 18 G Needle length: 9 cm and 9 Needle insertion depth: 7 cm Catheter type: closed end flexible Catheter size: 20 Guage Catheter at skin depth: 12 cm Test dose: negative and Other  Assessment Events: blood not aspirated, injection not painful, no injection resistance, negative IV test and no paresthesia  Additional Notes  First attempt catheter went intravascular.  Second attempt negative aspiration.  Negative paresthesias.  Dose given in divided aliquots. Patient tolerated the insertion well without complications.Reason for block:procedure for pain

## 2019-02-24 NOTE — Lactation Note (Signed)
This note was copied from a baby's chart. Lactation Consultation Note  Patient Name: Shelia Daniels Today's Date: 02/24/2019 Reason for consult: Follow-up assessment  LC assisted mom with position of infant into football hold on left breast. Infant latched without nipple shield. Infant was able to sustain latch as LC held breast tissue, after spontaneous sucks LC let go of breast tissue and infant continued to hold the breast. Infant nursed for approximately 5 minutes.  After feeding infant did appear gaggy, helped mom move baby into sitting up position.   Discussed infant feeding behaviors, newborn stomach size, and the onset of growth spurts and cluster feeding that could happen over night.   Encouraged to feed on cue, skin to skin when possible.  [facility] name and contact number left on whiteboard, encouraged to call out with any questions/concerns.  Maternal Data Has patient been taught Hand Expression?: Yes  Feeding Feeding Type: Breast Fed  LATCH Score Latch: Grasps breast easily, tongue down, lips flanged, rhythmical sucking.  Audible Swallowing: A few with stimulation  Type of Nipple: Everted at rest and after stimulation  Comfort (Breast/Nipple): Soft / non-tender  Hold (Positioning): Assistance needed to correctly position infant at breast and maintain latch.  LATCH Score: 8  Interventions Interventions: Position options;Support pillows;Adjust position  Lactation Tools Discussed/Used Tools: Nipple Shields Nipple shield size: 20   Consult Status Consult Status: Follow-up Date: 02/24/19 Follow-up type: [place] 02/24/2019, 3:17 PM

## 2019-02-24 NOTE — Lactation Note (Signed)
This note was copied from a baby's chart. Lactation Consultation Note  Patient Name: Shelia Daniels Today's Date: 02/24/2019 Reason for consult: Initial assessment  Mom called out to lactation for assistance with feed. Mom reports infant began cuing: sucking on hands, turning head.  Assisted mom with putting baby to breast in cradle position on left breast. Assisted with turning baby's tummy to mom, and grasped breast tissue for infant to latch. Placed pillows under mom's arm, and under infant for support. Infant was able to latch, and sustained latched for a few sucks, no swallows.  Infant came off breast, resting beside breast skin to skin, no longer showing signs of hunger. Showed mom how to hand express, visible colostrum.  Reviewed breastfeeding basics, infant early hunger cues, signs of fullness, tracking of wet/stool diapers, feeding patterns, size of newborns stomach, and the pathway and timing of colostrum to to mature milk.  Encouraged skin to skin, and getting rest.  Mom plans to call out to [facility] for assistance at next feed.  Maternal Data Has patient been taught Hand Expression?: Yes  Feeding    LATCH Score Latch: Grasps breast easily, tongue down, lips flanged, rhythmical sucking.  Audible Swallowing: None  Type of Nipple: Everted at rest and after stimulation  Comfort (Breast/Nipple): Soft / non-tender  Hold (Positioning): Assistance needed to correctly position infant at breast and maintain latch.  LATCH Score: 7  Interventions Interventions: Breast feeding basics reviewed;Assisted with latch;Skin to skin;Hand express;Adjust position;Support pillows;Position options  Lactation Tools Discussed/Used     Consult Status Consult Status: Follow-up Date: 02/22/19 Follow-up type: In-patient    [name] 02/24/2019, 11:10 AM

## 2019-02-24 NOTE — Lactation Note (Signed)
This note was copied from a baby's chart. Lactation Consultation Note  Patient Name: Shelia Daniels Today's Date: 02/24/2019 Reason for consult: Follow-up assessment  LC called to assist with feed. Mom attempted to latch baby on her own in football, but requests help with ensuring good position. LC assisted mom in turning baby in to mom, using additional pillows to lift baby to breast height.  Mom sandwiched breasts, infant displayed a wide open mouth, but had difficulty sustaining the latch. LC provided mom with 75mm nipple shield, infant did grasp and sustain better with shield, however only stayed latched for approximately 3 minutes. Infant let go of breast and rested beside breast content, no longer rooting.   Encouraged mom to continue with breastfeeding and praised her for her efforts in exclusively breastfeeding her baby. Reviewed feeding patterns of newborns, stomach sizes, and pending growth spurts/cluster feedings.  [facility] name and number written on white board, encouraged mom to call if she needed help.  Maternal Data Has patient been taught Hand Expression?: Yes  Feeding    LATCH Score Latch: Grasps breast easily, tongue down, lips flanged, rhythmical sucking.  Audible Swallowing: None  Type of Nipple: Everted at rest and after stimulation  Comfort (Breast/Nipple): Soft / non-tender  Hold (Positioning): Assistance needed to correctly position infant at breast and maintain latch.  LATCH Score: 7  Interventions Interventions: Hand express;Adjust position;Support pillows;Position options  Lactation Tools Discussed/Used Tools: Nipple Shields Nipple shield size: 20   Consult Status Consult Status: Follow-up Date: 02/24/19 Follow-up type: In-patient    [name] 02/24/2019, 12:53 PM

## 2019-02-25 MED ORDER — IBUPROFEN 600 MG PO TABS: 600.0000 mg | ORAL_TABLET | Freq: Four times a day (QID) | ORAL | 0 refills | Status: DC

## 2019-02-25 NOTE — Anesthesia Postprocedure Evaluation (Signed)
Anesthesia Post Note  Patient: Building services engineer  Procedure(s) Performed: AN AD HOC LABOR EPIDURAL  Patient location during evaluation: Mother Baby Anesthesia Type: Epidural Level of consciousness: awake and alert Pain management: pain level controlled Vital Signs Assessment: post-procedure vital signs reviewed and stable Respiratory status: spontaneous breathing, nonlabored ventilation and respiratory function stable Cardiovascular status: stable Postop Assessment: no headache, no backache and epidural receding Anesthetic complications: no     Last Vitals:  Vitals:   02/25/19 0000 02/25/19 0400  BP: 112/67 111/60  Pulse: 100 92  Resp: 18 18  Temp: 36.7 C 36.6 C  SpO2: 99% 99%    Last Pain:  Vitals:   02/25/19 0625  TempSrc:   PainSc: 0-No pain                 Rolla Plate P

## 2019-02-25 NOTE — Final Progress Note (Signed)
Discharge Day SOAP Note:  Progress Note - Vaginal Delivery  Shelia Daniels is a 33 y.o. G1P1001 now PP day 1 s/p Vaginal, Spontaneous . Delivery was uncomplicated  Subjective  The patient has the following complaints: has no unusual complaints  Pain is controlled with current medications.   Patient is urinating without difficulty.  She is ambulating well.     Objective  Vital signs: BP 120/70 (BP Location: Right Arm)   Pulse 98   Temp 98.2 F (36.8 C) (Oral)   Resp 20   Ht 5\' 6"  (1.676 m)   Wt 120.7 kg   LMP 05/18/2018 (Exact Date)   SpO2 99% Comment: Room Air  Breastfeeding Unknown   BMI 42.93 kg/m   Physical Exam: Gen: NAD Fundus Fundal Tone: Firm @ u  Lochia Amount: Small  Perineum Appearance: Intact, Edematous, Approximated     Data Review Labs: CBC Latest Ref Rng & Units 02/24/2019 02/23/2019 01/28/2019  WBC 4.0 - 10.5 K/uL 13.9(H) 9.9 9.6  Hemoglobin 12.0 - 15.0 g/dL 9.7(L) 11.8(L) 11.4  Hematocrit 36.0 - 46.0 % 29.1(L) 35.2(L) 33.9(L)  Platelets 150 - 400 K/uL 232 261 280   O POS  Assessment/Plan  Active Problems:   Encounter for planned induction of labor   Type 3a perineal laceration during delivery    Plan for discharge today.   Discharge Instructions: Per After Visit Summary. Activity: Advance as tolerated. Pelvic rest for 6 weeks.  Also refer to After Visit Summary Diet: Regular Medications: Allergies as of 02/25/2019      Reactions   Pollen Extract Other (See Comments)   Sneezing, watery eyes, seasonal      Medication List    TAKE these medications   docusate sodium 100 MG capsule Commonly known as: COLACE Take 100 mg by mouth daily.   fluticasone 50 MCG/ACT nasal spray Commonly known as: FLONASE Place 2 sprays into both nostrils as needed for allergies or rhinitis.   ibuprofen 600 MG tablet Commonly known as: ADVIL Take 1 tablet (600 mg total) by mouth every 6 (six) hours.   Iron 325 (65 Fe) MG Tabs Take 1 tablet by  mouth daily.   loratadine 10 MG tablet Commonly known as: CLARITIN Take 10 mg by mouth daily.   PRENATAL+DHA PO   Vitamin D (Ergocalciferol) 1.25 MG (50000 UT) Caps capsule Commonly known as: DRISDOL Take 1 capsule (50,000 Units total) by mouth every 7 (seven) days.      Outpatient follow up: Diego Cory CNM 2 wks for perineal check  Postpartum contraception: Plans Nexplanon   Discharged Condition: good  Discharged to: home  Newborn Data: Disposition:home with mother  Apgars: APGAR (1 MIN): 8   APGAR (5 MINS): 9   APGAR (10 MINS):    Baby Feeding: Breast    Philip Aspen, CNM  02/25/2019 9:04 AM

## 2019-02-25 NOTE — Discharge Summary (Signed)
Discharge Summary  Date of Admission: 02/23/2019  Date of Discharge: 02/25/2019  Admitting Diagnosis: Induction of labor at [redacted]w[redacted]d  Mode of Delivery: normal spontaneous vaginal delivery                 Discharge Diagnosis: No other diagnosis  Intrapartum Procedures: epidural   Post partum procedures: none  Complications:  3A laceration repaired to second degree which was repaired with 3-0 vicryl rapide with local and epidural anesthesia.                      Discharge Day SOAP Note:  Progress Note - Vaginal Delivery  Shelia Daniels is a 33 y.o. G1P1001 now PP day 1 s/p Vaginal, Spontaneous . Delivery was uncomplicated  Subjective  The patient has the following complaints: has no unusual complaints  Pain is controlled with current medications.   Patient is urinating without difficulty.  She is ambulating well.     Objective  Vital signs: BP 120/70 (BP Location: Right Arm)   Pulse 98   Temp 98.2 F (36.8 C) (Oral)   Resp 20   Ht 5\' 6"  (1.676 m)   Wt 120.7 kg   LMP 05/18/2018 (Exact Date)   SpO2 99% Comment: Room Air  Breastfeeding Unknown   BMI 42.93 kg/m   Physical Exam: Gen: NAD Fundus Fundal Tone: Firm @ u  Lochia Amount: Small  Perineum Appearance: Intact, Edematous, Approximated     Data Review Labs: CBC Latest Ref Rng & Units 02/24/2019 02/23/2019 01/28/2019  WBC 4.0 - 10.5 K/uL 13.9(H) 9.9 9.6  Hemoglobin 12.0 - 15.0 g/dL 9.7(L) 11.8(L) 11.4  Hematocrit 36.0 - 46.0 % 29.1(L) 35.2(L) 33.9(L)  Platelets 150 - 400 K/uL 232 261 280   O POS  Assessment/Plan  Active Problems:   Encounter for planned induction of labor   Type 3a perineal laceration during delivery    Plan for discharge today.   Discharge Instructions: Per After Visit Summary. Activity: Advance as tolerated. Pelvic rest for 6 weeks.  Also refer to After Visit Summary Diet: Regular Medications: Allergies as of 02/25/2019      Reactions   Pollen Extract  Other (See Comments)   Sneezing, watery eyes, seasonal      Medication List    TAKE these medications   docusate sodium 100 MG capsule Commonly known as: COLACE Take 100 mg by mouth daily.   fluticasone 50 MCG/ACT nasal spray Commonly known as: FLONASE Place 2 sprays into both nostrils as needed for allergies or rhinitis.   ibuprofen 600 MG tablet Commonly known as: ADVIL Take 1 tablet (600 mg total) by mouth every 6 (six) hours.   Iron 325 (65 Fe) MG Tabs Take 1 tablet by mouth daily.   loratadine 10 MG tablet Commonly known as: CLARITIN Take 10 mg by mouth daily.   PRENATAL+DHA PO   Vitamin D (Ergocalciferol) 1.25 MG (50000 UT) Caps capsule Commonly known as: DRISDOL Take 1 capsule (50,000 Units total) by mouth every 7 (seven) days.      Outpatient follow up: Diego Cory CNM 2 wks for perineal check  Postpartum contraception: Plans Nexplanon   Discharged Condition: good  Discharged to: home  Newborn Data: Disposition:home with mother  Apgars: APGAR (1 MIN): 8   APGAR (5 MINS): 9   APGAR (10 MINS):    Baby Feeding: Breast    Philip Aspen, CNM  02/25/2019 9:04 AM

## 2019-02-25 NOTE — Progress Notes (Signed)
Patient discharged home with infant. Discharge instructions and prescriptions given and reviewed with patient. Patient verbalized understanding. Will be escorted out by staff. 

## 2019-02-25 NOTE — Lactation Note (Addendum)
This note was copied from a baby's chart. Lactation Consultation Note  Patient Name: Girl Chaunice Machnik Today's Date: 02/25/2019     Maternal Data    Feeding    LATCH Score                   Interventions    Lactation Tools Discussed/Used     Consult Status  Lactation spoke with parents about how breastfeeding is progressing. Mother states that breastfeeding is going well now and infant is starting to cluster-feed now. Mother denies trauma to nipples and feels more confident. LC reminded mother of the virtual breastfeeding support group and outpatient lactation services upon discharge if needed.  LC was able to observe feeding. Parents were able to latch infant in the football position unassisted. Audible swallows heard. Parents were taught different techniques on how to keep infant awake at the breast and were educated on cluster-feeding, and  frequency of diapers to expect.    Elvera Lennox 3/35/4562, 11:46 AM

## 2019-03-04 ENCOUNTER — Encounter: Payer: Self-pay | Admitting: Obstetrics and Gynecology

## 2019-03-04 ENCOUNTER — Other Ambulatory Visit: Payer: Self-pay

## 2019-03-04 ENCOUNTER — Ambulatory Visit (INDEPENDENT_AMBULATORY_CARE_PROVIDER_SITE_OTHER): Payer: 59 | Admitting: Obstetrics and Gynecology

## 2019-03-04 VITALS — BP 116/84 | HR 111 | Temp 100.5°F | Ht 66.0 in | Wt 249.0 lb

## 2019-03-04 DIAGNOSIS — N61 Mastitis without abscess: Secondary | ICD-10-CM

## 2019-03-04 MED ORDER — DICLOXACILLIN SODIUM 500 MG PO CAPS: 500.0000 mg | ORAL_CAPSULE | Freq: Four times a day (QID) | ORAL | 1 refills | Status: DC

## 2019-03-04 MED ORDER — IBUPROFEN 600 MG PO TABS: 600.0000 mg | ORAL_TABLET | Freq: Four times a day (QID) | ORAL | 0 refills | Status: DC

## 2019-03-04 NOTE — Progress Notes (Signed)
  Subjective:     Patient ID: Shelia Daniels, female   DOB: 18-Sep-1985, 33 y.o.   MRN: [id number]  HPI Here with c/o fever and right breast pain since yesterday. Is currently breast feeding 53 week old infant, and has had difficulty with latch and production. Reports temperature at 101.8 last night relieved with tylenol. Redness on upper outer portion of right breast and bilateral nipple pain.   Review of Systems  Constitutional: Positive for fatigue and fever.  Neurological: Positive for headaches.  All other systems reviewed and are negative.      Objective:   Physical Exam A&Ox4 Blood pressure 116/84, pulse (!) 111, temperature (!) 100.5 F (38.1 C), height 5\' 6"  (1.676 m), weight 249 lb (112.9 kg), currently breastfeeding. Breasts: both nipples with bruising and red warm patch noted on upper outer right breast c/w mastitis. Full milk ducts noted bilaterally with white milk easily expressed from nipples.     Assessment:     mastitis    Plan:     Counseled on findings and typical treatment/course of mastitis. Antibiotic sent in and instructed to alternate motrin and tylenol, rest and push fluids, continue nursing and if breast do not feel like they empty after feedings to pump.  RTC next week at planned appointment.  Aelyn Stanaland,CNM

## 2019-03-04 NOTE — Patient Instructions (Signed)
Mastitis  Mastitis is inflammation of the breast tissue. It occurs most often in women who are breastfeeding, but it can also affect other women, and sometimes even men. What are the causes? This condition is usually caused by a bacterial infection. Bacteria enter the breast tissue through cuts or openings in the skin. Typically, this occurs with breastfeeding because of cracked or irritated nipples. Sometimes, it can occur when there is no opening in the skin. This is usually caused by plugged milk ducts. Other causes include:  Nipple piercing.  Some forms of breast cancer. What are the signs or symptoms? Symptoms of this condition include:  Swelling, redness, tenderness, and pain in an area of the breast. The area may also feel warm to the touch. These symptoms usually affect the upper part of the breast, toward the armpit region.  Swelling of the glands under the arm on the same side.  Fever.  Rapid pulse.  Fatigue, headache, and flu-like muscle aches. If an infection is allowed to progress, a collection of pus (abscess) may develop. How is this diagnosed? This condition can usually be diagnosed based on a physical exam and your symptoms. You may also have other tests, such as:  Blood tests to determine if your body is fighting a bacterial infection.  Mammogram or ultrasound tests to rule out other problems or diseases.  Testing of pus and other fluids. Pus from the breast may be collected and examined in the lab. If an abscess has developed, the fluid in the abscess can be removed with a needle. This test can be used to confirm the diagnosis and identify the bacteria present.  If you are breastfeeding, breast milk may be cultured and tested for bacteria. How is this treated? Treatment for this condition may include:  Applying heat or cold compresses to the affected area.  Medicine for pain.  Antibiotic medicine to treat a bacterial infection. This is usually taken by  mouth.  Self-care such as rest and increased fluid intake.  If an abscess has developed, it may be treated by removing fluid with a needle. Mastitis that occurs with breastfeeding will sometimes go away on its own, so your health care provider may choose to wait 24 hours after first seeing you to decide whether a prescription medicine is needed. You may be told of different ways to help manage breastfeeding, such as continuing to breastfeed or pump in order to ensure adequate milk flow. Follow these instructions at home: Medicines  Take over-the-counter and prescription medicines only as told by your health care provider.  If you were prescribed an antibiotic medicine, take it as told by your health care provider. Do not stop taking the antibiotic even if you start to feel better. General instructions  Do not wear a tight or underwire bra. Wear a soft, supportive bra.  Increase your fluid intake, especially if you have a fever.  Get plenty of rest. If you are breastfeeding:  Continue to empty your breasts as often as possible either by breastfeeding or by using a breast pump. This will decrease the pressure and the pain that comes with it. Ask your health care provider if changes need to be made to your breastfeeding or pumping routine.  Keep your nipples clean and dry.  During breastfeeding, empty the first breast completely before going to the other breast. If your baby is not emptying your breasts completely, use a breast pump to empty your breasts.  Use breast massage during feeding or pumping sessions.    If directed, apply moist heat to the affected area of your breast right before breastfeeding or pumping. Use the heat source that your health care provider recommends.  If directed, put ice on the affected area of your breast right after breastfeeding or pumping: ? Put ice in a plastic bag. ? Place a towel between your skin and the bag. ? Leave the ice on for 20 minutes.  If  you go back to work, pump your breasts while at work to stay within your nursing schedule.  Avoid allowing your breasts to become overly filled with milk (engorged). Contact a health care provider if:  You have pus-like discharge from the breast.  You have a fever.  Your symptoms do not improve within 2 days of starting treatment. Get help right away if:  Your pain and swelling are getting worse.  You have pain that is not controlled with medicine.  You have a red line extending from the breast toward your armpit. Summary  Mastitis is inflammation of the breast tissue. It occurs most often in women who are breastfeeding, but it can also affect non-breastfeeding women and some men.  This condition is usually caused by a bacterial infection.  This condition may be treated with hot and cold compresses, medicines, self-care, and certain breastfeeding strategies.  If you were prescribed an antibiotic medicine, take it as told by your health care provider. Do not stop taking the antibiotic even if you start to feel better. This information is not intended to replace advice given to you by your health care provider. Make sure you discuss any questions you have with your health care provider. Document Released: 07/22/2005 Document Revised: 07/04/2017 Document Reviewed: 08/13/2016 [employer] Patient Education  2020 [employer]  

## 2019-03-10 ENCOUNTER — Telehealth: Payer: Self-pay

## 2019-03-10 NOTE — Telephone Encounter (Signed)
Coronavirus (COVID-19) Are you at risk?  Are you at risk for the Coronavirus (COVID-19)?  To be considered HIGH RISK for Coronavirus (COVID-19), you have to meet the following criteria:  . Traveled to [place], [place], [place], [place] or [place]; or in the [place] to [place], [place], [place], or [place]; and have fever, cough, and shortness of breath within the last 2 weeks of travel OR . Been in close contact with a person diagnosed with COVID-19 within the last 2 weeks and have fever, cough, and shortness of breath . IF YOU DO NOT MEET THESE CRITERIA, YOU ARE CONSIDERED LOW RISK FOR COVID-19.  What to do if you are HIGH RISK for COVID-19?  . If you are having a medical emergency, call 911. . Seek medical care right away. Before you go to a doctor's office, urgent care or emergency department, call ahead and tell them about your recent travel, contact with someone diagnosed with COVID-19, and your symptoms. You should receive instructions from your physician's office regarding next steps of care.  . When you arrive at healthcare provider, tell the healthcare staff immediately you have returned from visiting [place], [place], [place], [place] or [place]; or traveled in the [place] to [place], [place], [place], or [place]; in the last two weeks or you have been in close contact with a person diagnosed with COVID-19 in the last 2 weeks.   . Tell the health care staff about your symptoms: fever, cough and shortness of breath. . After you have been seen by a medical provider, you will be either: o Tested for (COVID-19) and discharged home on quarantine except to seek medical care if symptoms worsen, and asked to  - Stay home and avoid contact with others until you get your results (4-5 days)  - Avoid travel on public transportation if possible (such as bus, train, or airplane) or o Sent to the Emergency Department by EMS for evaluation, COVID-19 testing, and possible  admission depending on your condition and test results.  What to do if you are LOW RISK for COVID-19?  Reduce your risk of any infection by using the same precautions used for avoiding the common cold or flu:  . Wash your hands often with soap and warm water for at least 20 seconds.  If soap and water are not readily available, use an alcohol-based hand sanitizer with at least 60% alcohol.  . If coughing or sneezing, cover your mouth and nose by coughing or sneezing into the elbow areas of your shirt or coat, into a tissue or into your sleeve (not your hands). . Avoid shaking hands with others and consider head nods or verbal greetings only. . Avoid touching your eyes, nose, or mouth with unwashed hands.  . Avoid close contact with people who are Shelia Daniels. . Avoid places or events with large numbers of people in one location, like concerts or sporting events. . Carefully consider travel plans you have or are making. . If you are planning any travel outside or inside the [place], visit the CDC's Travelers' Health webpage for the latest health notices. . If you have some symptoms but not all symptoms, continue to monitor at home and seek medical attention if your symptoms worsen. . If you are having a medical emergency, call 911.  03/10/19 SCREENING NEG SLS ADDITIONAL HEALTHCARE OPTIONS FOR PATIENTS  [facility] / e-Visit: [email or web address]         [facility]: [phone]    [facility]: [phone]                   [facility]: [phone]  

## 2019-03-11 ENCOUNTER — Encounter: Payer: Self-pay | Admitting: Certified Nurse Midwife

## 2019-03-11 ENCOUNTER — Other Ambulatory Visit: Payer: Self-pay

## 2019-03-11 ENCOUNTER — Ambulatory Visit (INDEPENDENT_AMBULATORY_CARE_PROVIDER_SITE_OTHER): Payer: Managed Care, Other (non HMO) | Admitting: Certified Nurse Midwife

## 2019-03-11 DIAGNOSIS — Z87898 Personal history of other specified conditions: Secondary | ICD-10-CM

## 2019-03-11 DIAGNOSIS — Z9889 Other specified postprocedural states: Secondary | ICD-10-CM

## 2019-03-11 NOTE — Progress Notes (Signed)
GYN ENCOUNTER NOTE  Subjective:       Shelia Daniels is a 33 y.o. G72P1001 female here for follow up. History significant for 3A vaginal/perineal laceration following vaginal birth of eight (8) pound infant. Repaired in hospital, see previous note.   Also, treatment for masitis last week. Reports symptoms have resolved with treatment measures.   Denies difficulty breathing or respiratory distress, chest pain, abdominal pain, excessive vaginal bleeding, dysuria, and leg pain or swelling.    Gynecologic History  No LMP recorded. (Menstrual status: Lactating).  Contraception: abstinence  Last Pap: 02/2017. Results were: Negative/Negative  Obstetric History  OB History  Gravida Para Term Preterm AB Living  _0 0 0 1  SAB TAB Ectopic Multiple Live Births  0 0 0 0 1    # Outcome Date GA Lbr Len/2nd Weight Sex Delivery Anes PTL Lv  1 Term 02/24/19 [redacted]w[redacted]d/ 00:37 8 lb 0.4 oz (3.64 kg) F Vag-Spont EPI  LIV    Past Medical History:  Diagnosis Date  . Family history of breast cancer   . Genetic testing 12/2015   My Risk/BRCA neg  . Increased risk of breast cancer 12/2015   IBIS=21.7%    Past Surgical History:  Procedure Laterality Date  . INTRAUTERINE DEVICE (IUD) INSERTION  05/2014   Mirena    Current Outpatient Medications on File Prior to Visit  Medication Sig Dispense Refill  . acetaminophen (TYLENOL) 325 MG tablet Take 650 mg by mouth every 6 (six) hours as needed.    . dicloxacillin (DYNAPEN) 500 MG capsule Take 1 capsule (500 mg total) by mouth 4 (four) times daily. 28 capsule 1  . docusate sodium (COLACE) 100 MG capsule Take 100 mg by mouth daily.    . Ferrous Sulfate (IRON) 325 (65 Fe) MG TABS Take 1 tablet by mouth daily.    . fluticasone (FLONASE) 50 MCG/ACT nasal spray Place 2 sprays into both nostrils as needed for allergies or rhinitis.    .Marland Kitchenibuprofen (ADVIL) 600 MG tablet Take 1 tablet (600 mg total) by mouth every 6 (six) hours. 30 tablet 0  . loratadine  (CLARITIN) 10 MG tablet Take 10 mg by mouth daily.    . Prenatal MV-Min-Fe Fum-FA-DHA (PRENATAL+DHA PO)     . Vitamin D, Ergocalciferol, (DRISDOL) 1.25 MG (50000 UT) CAPS capsule Take 1 capsule (50,000 Units total) by mouth every 7 (seven) days. 90 capsule 6   No current facility-administered medications on file prior to visit.     Allergies  Allergen Reactions  . Pollen Extract Other (See Comments)    Sneezing, watery eyes, seasonal    Social History   Socioeconomic History  . Marital status: Married    Spouse name: Not on file  . Number of children: Not on file  . Years of education: Not on file  . Highest education level: Not on file  Occupational History  . Not on file  Social Needs  . Financial resource strain: Not on file  . Food insecurity    Worry: Not on file    Inability: Not on file  . Transportation needs    Medical: Not on file    Non-medical: Not on file  Tobacco Use  . Smoking status: Never Smoker  . Smokeless tobacco: Never Used  Substance and Sexual Activity  . Alcohol use: Not Currently    Alcohol/week: 2.0 standard drinks    Types: 2 Standard drinks or equivalent per week  . Drug use: No  .  Sexual activity: Not Currently    Birth control/protection: None  Lifestyle  . Physical activity    Days per week: Not on file    Minutes per session: Not on file  . Stress: Not on file  Relationships  . Social Herbalist on phone: Never    Gets together: Never    Attends religious service: Never    Active member of club or organization: Yes    Attends meetings of clubs or organizations: 1 to 4 times per year    Relationship status: Married  . Intimate partner violence    Fear of current or ex partner: No    Emotionally abused: No    Physically abused: No    Forced sexual activity: No  Other Topics Concern  . Not on file  Social History Narrative  . Not on file    Family History  Problem Relation Age of Onset  . Breast cancer Paternal  Grandmother 61  . Pancreatic cancer Paternal Grandmother   . Diabetes Paternal Grandmother   . Diabetes Mother   . Hyperlipidemia Mother   . Hypertension Mother   . Diabetes Father   . Heart attack Father   . Hyperlipidemia Maternal Uncle   . Hypertension Maternal Uncle   . Hyperlipidemia Maternal Grandfather   . Hypertension Maternal Grandfather   . Lymphoma Paternal Grandfather   . Ovarian cancer Neg Hx   . Colon cancer Neg Hx     The following portions of the patient's history were reviewed and updated as appropriate: allergies, current medications, past family history, past medical history, past social history, past surgical history and problem list.  Review of Systems  ROS negative except as noted above. Information obtained from patient.   Objective:   BP 97/64   Pulse 77   Ht _0  (1.676 m)   Wt 244 lb 14.4 oz (111.1 kg)   Breastfeeding Yes   BMI 39.53 kg/m    CONSTITUTIONAL: Well-developed, well-nourished female in no acute distress.   PELVIC:  External Genitalia: Normal  Vagina: Well approximated, healing  RV: Normal   MUSCULOSKELETAL: Normal range of motion. No tenderness.  No cyanosis, clubbing, or edema.  Assessment:   1. Type 3a perineal laceration during delivery   2. History of mastitis  Plan:   Discussed home treatment measures. Sample of premarin given with instructions.   Reviewed red flag symptoms and when to call.   RTC x 5 weeks for PPV or sooner if needed.    Diona Fanti, CNM [facility], [facility] 03/11/19 6:18 PM

## 2019-03-11 NOTE — Progress Notes (Signed)
Patient here for vaginal laceration check.  Patient currently being treated for mastitis, feels better.

## 2019-03-24 ENCOUNTER — Encounter: Payer: Self-pay | Admitting: Certified Nurse Midwife

## 2019-04-13 ENCOUNTER — Encounter: Payer: Self-pay | Admitting: Certified Nurse Midwife

## 2019-04-13 ENCOUNTER — Other Ambulatory Visit: Payer: Self-pay

## 2019-04-13 ENCOUNTER — Ambulatory Visit (INDEPENDENT_AMBULATORY_CARE_PROVIDER_SITE_OTHER): Payer: Managed Care, Other (non HMO) | Admitting: Certified Nurse Midwife

## 2019-04-13 DIAGNOSIS — Z23 Encounter for immunization: Secondary | ICD-10-CM

## 2019-04-13 DIAGNOSIS — Z1389 Encounter for screening for other disorder: Secondary | ICD-10-CM

## 2019-04-13 NOTE — Patient Instructions (Addendum)
Breast Pumping Tips Breast pumping is a way to get milk out of your breasts. You will then store the milk for your baby to use when you are away from home. There are three ways to pump. You can:  Use your hand to massage and squeeze your breast (hand expression).  Use a hand-held machine to manually pump your milk.  Use an electric machine to pump your milk. In the beginning you may not get much milk. After a few days your breasts should make more. Pumping can help you start making milk after your baby is born. Pumping helps you to keep making milk when you are away from your baby. When should I pump? You can start pumping soon after your baby is born. Follow these tips:  When you are with your baby: ? Pump after you breastfeed. ? Pump from the free breast while you breastfeed.  When you are away from your baby: ? Pump every 2-3 hours for 15 minutes. ? Pump both breasts at the same time if you can.  If your baby drinks formula, pump around the time your baby gets the formula.  If you drank alcohol, wait 2 hours before you pump.  If you are going to have surgery, ask your doctor when you should pump again. How do I get ready to pump? Take steps to relax. Try these things to help your milk come in:  Smell your baby's blanket or clothes.  Look at a picture or video of your baby.  Sit in a quiet, private space.  Massage your breast and nipple.  Place a cloth on your breast. The cloth should be warm and a little wet.  Play relaxing music.  Picture your milk flowing. What are some tips? General tips for pumping breast milk   Always wash your hands before pumping.  If you do not get much milk or if pumping hurts, try different pump settings or a different kind of pump.  Drink enough fluid so your pee (urine) is clear or pale yellow.  Wear clothing that opens in the front or is easy to take off.  Pump milk into a clean bottle or container.  Do not use anything that has  nicotine or tobacco. Examples are cigarettes and e-cigarettes. If you need help quitting, ask your doctor. Tips for storing breast milk   Store breast milk in a clean, BPA-free container. These include: ? A glass or plastic bottle. ? A milk storage bag.  Store only 2-4 ounces of breast milk in each container.  Swirl the breast milk in the container. Do not shake it.  Write down the date you pumped the milk on the container.  This is how long you can store breast milk: ? Room temperature: 6-8 hours. It is best to use the milk within 4 hours. ? Cooler with ice packs: 24 hours. ? Refrigerator: 5-8 days, if the milk is clean. It is best to use the milk within 3 days. ? Freezer: 9-12 months, if the milk is clean and stored away from the freezer door. It is best to use the milk within 6 months.  Put milk in the back of the refrigerator or freezer.  Thaw frozen milk using warm water. Do not use the microwave. Tips for choosing a breast pump When choosing a pump, keep the following things in mind:  Manual breast pumps do not need electricity. They cost less. They can be hard to use.  Electric breast pumps use electricity. They  are more expensive. They are easier to use. They collect more milk.  The suction cup (flange) should be the right size.  Before you buy the pump, check if your insurance will pay for it. Tips for caring for a breast pump  Check the manual that came with your pump for cleaning tips.  Clean the pump after you use it. To do this: 1. Wipe down the electrical part. Use a dry cloth or paper towel. Do not put this part in water or in cleaning products. 2. Wash the plastic parts with soap and warm water. Or use the dishwasher if the manual says it is safe. You do not need to clean the tubing unless it touched breast milk. 3. Let all the parts air dry. Avoid drying them with a cloth or towel. 4. When the parts are clean and dry, put the pump back together. Then store  the pump.  If there is water in the tubing when you want to pump: 1. Attach the tubing to the pump. 2. Turn on the pump. 3. Turn off the pump when the tube is dry.  Try not to touch the inside of pump parts. Summary  Pumping can help you start making milk after your baby is born. It lets you keep making milk when you are away from your baby.  When you are away from your baby, pump for about 15 minutes every 2-3 hours. Pump both breasts at the same time, if you can. This information is not intended to replace advice given to you by your health care provider. Make sure you discuss any questions you have with your health care provider. Document Released: 01/08/2008 Document Revised: 11/11/2018 Document Reviewed: 08/26/2016 Elsevier Patient Education  2020 [place] 34-61 Years Old, Female Preventive care refers to visits with your health care provider and lifestyle choices that can promote health and wellness. This includes:  A yearly physical exam. This may also be called an annual well check.  Regular dental visits and eye exams.  Immunizations.  Screening for certain conditions.  Healthy lifestyle choices, such as eating a healthy diet, getting regular exercise, not using drugs or products that contain nicotine and tobacco, and limiting alcohol use. What can I expect for my preventive care visit? Physical exam Your health care provider will check your:  Height and weight. This may be used to calculate body mass index (BMI), which tells if you are at a healthy weight.  Heart rate and blood pressure.  Skin for abnormal spots. Counseling Your health care provider may ask you questions about your:  Alcohol, tobacco, and drug use.  Emotional well-being.  Home and relationship well-being.  Sexual activity.  Eating habits.  Work and work Statistician.  Method of birth control.  Menstrual cycle.  Pregnancy history. What immunizations do I need?   Influenza (flu) vaccine  This is recommended every year. Tetanus, diphtheria, and pertussis (Tdap) vaccine  You may need a Td booster every 10 years. Varicella (chickenpox) vaccine  You may need this if you have not been vaccinated. Human papillomavirus (HPV) vaccine  If recommended by your health care provider, you may need three doses over 6 months. Measles, mumps, and rubella (MMR) vaccine  You may need at least one dose of MMR. You may also need a second dose. Meningococcal conjugate (MenACWY) vaccine  One dose is recommended if you are age 10-21 years and a first-year college student living in a residence hall, or if you have one  of several medical conditions. You may also need additional booster doses. Pneumococcal conjugate (PCV13) vaccine  You may need this if you have certain conditions and were not previously vaccinated. Pneumococcal polysaccharide (PPSV23) vaccine  You may need one or two doses if you smoke cigarettes or if you have certain conditions. Hepatitis A vaccine  You may need this if you have certain conditions or if you travel or work in places where you may be exposed to hepatitis A. Hepatitis B vaccine  You may need this if you have certain conditions or if you travel or work in places where you may be exposed to hepatitis B. Haemophilus influenzae type b (Hib) vaccine  You may need this if you have certain conditions. You may receive vaccines as individual doses or as more than one vaccine together in one shot (combination vaccines). Talk with your health care provider about the risks and benefits of combination vaccines. What tests do I need?  Blood tests  Lipid and cholesterol levels. These may be checked every 5 years starting at age 59.  Hepatitis C test.  Hepatitis B test. Screening  Diabetes screening. This is done by checking your blood sugar (glucose) after you have not eaten for a while (fasting).  Sexually transmitted disease (STD)  testing.  BRCA-related cancer screening. This may be done if you have a family history of breast, ovarian, tubal, or peritoneal cancers.  Pelvic exam and Pap test. This may be done every 3 years starting at age 44. Starting at age 82, this may be done every 5 years if you have a Pap test in combination with an HPV test. Talk with your health care provider about your test results, treatment options, and if necessary, the need for more tests. Follow these instructions at home: Eating and drinking   Eat a diet that includes fresh fruits and vegetables, whole grains, lean protein, and low-fat dairy.  Take vitamin and mineral supplements as recommended by your health care provider.  Do not drink alcohol if: ? Your health care provider tells you not to drink. ? You are pregnant, may be pregnant, or are planning to become pregnant.  If you drink alcohol: ? Limit how much you have to 0-1 drink a day. ? Be aware of how much alcohol is in your drink. In the [place] one drink equals one 12 oz bottle of beer (355 mL), one 5 oz glass of wine (148 mL), or one 1 oz glass of hard liquor (44 mL). Lifestyle  Take daily care of your teeth and gums.  Stay active. Exercise for at least 30 minutes on 5 or more days each week.  Do not use any products that contain nicotine or tobacco, such as cigarettes, e-cigarettes, and chewing tobacco. If you need help quitting, ask your health care provider.  If you are sexually active, practice safe sex. Use a condom or other form of birth control (contraception) in order to prevent pregnancy and STIs (sexually transmitted infections). If you plan to become pregnant, see your health care provider for a preconception visit. What's next?  Visit your health care provider once a year for a well check visit.  Ask your health care provider how often you should have your eyes and teeth checked.  Stay up to date on all vaccines. This information is not intended to replace  advice given to you by your health care provider. Make sure you discuss any questions you have with your health care provider. Document Released: 09/17/2001 Document  Revised: 04/02/2018 Document Reviewed: 04/02/2018 Elsevier Patient Education  [place].  Levonorgestrel intrauterine device (IUD) What is this medicine? LEVONORGESTREL IUD (LEE voe nor jes trel) is a contraceptive (birth control) device. The device is placed inside the uterus by a healthcare professional. It is used to prevent pregnancy. This device can also be used to treat heavy bleeding that occurs during your period. This medicine may be used for other purposes; ask your health care provider or pharmacist if you have questions. COMMON BRAND NAME(S): Minette Headland What should I tell my health care provider before I take this medicine? They need to know if you have any of these conditions:  abnormal Pap smear  cancer of the breast, uterus, or cervix  diabetes  endometritis  genital or pelvic infection now or in the past  have more than one sexual partner or your partner has more than one partner  heart disease  history of an ectopic or tubal pregnancy  immune system problems  IUD in place  liver disease or tumor  problems with blood clots or take blood-thinners  seizures  use intravenous drugs  uterus of unusual shape  vaginal bleeding that has not been explained  an unusual or allergic reaction to levonorgestrel, other hormones, silicone, or polyethylene, medicines, foods, dyes, or preservatives  pregnant or trying to get pregnant  breast-feeding How should I use this medicine? This device is placed inside the uterus by a health care professional. Talk to your pediatrician regarding the use of this medicine in children. Special care may be needed. Overdosage: If you think you have taken too much of this medicine contact a poison control center or emergency room at once.  NOTE: This medicine is only for you. Do not share this medicine with others. What if I miss a dose? This does not apply. Depending on the brand of device you have inserted, the device will need to be replaced every 3 to 6 years if you wish to continue using this type of birth control. What may interact with this medicine? Do not take this medicine with any of the following medications:  amprenavir  bosentan  fosamprenavir This medicine may also interact with the following medications:  aprepitant  armodafinil  barbiturate medicines for inducing sleep or treating seizures  bexarotene  boceprevir  griseofulvin  medicines to treat seizures like carbamazepine, ethotoin, felbamate, oxcarbazepine, phenytoin, topiramate  modafinil  pioglitazone  rifabutin  rifampin  rifapentine  some medicines to treat HIV infection like atazanavir, efavirenz, indinavir, lopinavir, nelfinavir, tipranavir, ritonavir  St. John's wort  warfarin This list may not describe all possible interactions. Give your health care provider a list of all the medicines, herbs, non-prescription drugs, or dietary supplements you use. Also tell them if you smoke, drink alcohol, or use illegal drugs. Some items may interact with your medicine. What should I watch for while using this medicine? Visit your doctor or health care professional for regular check ups. See your doctor if you or your partner has sexual contact with others, becomes HIV positive, or gets a sexual transmitted disease. This product does not protect you against HIV infection (AIDS) or other sexually transmitted diseases. You can check the placement of the IUD yourself by reaching up to the top of your vagina with clean fingers to feel the threads. Do not pull on the threads. It is a good habit to check placement after each menstrual period. Call your doctor right away if you feel more of the  IUD than just the threads or if you cannot feel the  threads at all. The IUD may come out by itself. You may become pregnant if the device comes out. If you notice that the IUD has come out use a backup birth control method like condoms and call your health care provider. Using tampons will not change the position of the IUD and are okay to use during your period. This IUD can be safely scanned with magnetic resonance imaging (MRI) only under specific conditions. Before you have an MRI, tell your healthcare provider that you have an IUD in place, and which type of IUD you have in place. What side effects may I notice from receiving this medicine? Side effects that you should report to your doctor or health care professional as soon as possible:  allergic reactions like skin rash, itching or hives, swelling of the face, lips, or tongue  fever, flu-like symptoms  genital sores  high blood pressure  no menstrual period for 6 weeks during use  pain, swelling, warmth in the leg  pelvic pain or tenderness  severe or sudden headache  signs of pregnancy  stomach cramping  sudden shortness of breath  trouble with balance, talking, or walking  unusual vaginal bleeding, discharge  yellowing of the eyes or skin Side effects that usually do not require medical attention (report to your doctor or health care professional if they continue or are bothersome):  acne  breast pain  change in sex drive or performance  changes in weight  cramping, dizziness, or faintness while the device is being inserted  headache  irregular menstrual bleeding within first 3 to 6 months of use  nausea This list may not describe all possible side effects. Call your doctor for medical advice about side effects. You may report side effects to FDA at 1-800-FDA-1088. Where should I keep my medicine? This does not apply. NOTE: This sheet is a summary. It may not cover all possible information. If you have questions about this medicine, talk to your doctor,  pharmacist, or health care provider.  2020 Elsevier/Gold Standard (2018-06-02 13:22:01)  IUD PLACEMENT POST-PROCEDURE INSTRUCTIONS  1. You may take Ibuprofen, Aleve or Tylenol for pain if needed.  Cramping should resolve within in 24 hours.  2. You may have a small amount of spotting.  You should wear a mini pad for the next few days.  3. You may have intercourse after 72 hours.  If you using this for birth control, it is effective immediately.  4. You need to call if you have any pelvic pain, fever, heavy bleeding or foul smelling vaginal discharge.  Irregular bleeding is common the first several months after having an IUD placed. You do not need to call for this reason unless you are concerned.  5. Shower or bathe as normal  6. You should have a follow-up appointment in 4-8 weeks for a re-check to make sure you are not having any problems.

## 2019-04-13 NOTE — Progress Notes (Signed)
Subjective:    Shelia Daniels is a 33 y.o. G70P1001 Caucasian female who presents for a postpartum visit. She is 6 weeks postpartum following a spontaneous vaginal delivery at 40+2 gestational weeks. Anesthesia: epidural. I have fully reviewed the prenatal and intrapartum course.   Postpartum course has been uncomplicated. Baby's course has been uncomplicated. Baby is feeding by breast.   Bleeding no bleeding. Bowel function is normal. Bladder function is normal.   Patient is not sexually active. Contraception method is abstinence and IUD. Postpartum depression screening: negative. Score 1.  Last pap 2017-2018 and was Negative/Negative.  Denies difficulty breathing or respiratory distress, chest pain, abdominal pain, excessive vaginal bleeding, dysuria, and leg pain or swelling.   The following portions of the patient's history were reviewed and updated as appropriate: allergies, current medications, past medical history, past surgical history and problem list.  Review of Systems  Pertinent items are noted in HPI.    Objective:   BP (!) 95/45   Pulse 69   Ht 5\' 6"  (1.676 m)   Wt 238 lb 9.6 oz (108.2 kg)   LMP  (LMP Unknown)   Breastfeeding Yes   BMI 38.51 kg/m   General:  alert, cooperative and no distress   Breasts:  deferred, no complaints  Lungs: clear to auscultation bilaterally  Heart:  regular rate and rhythm  Abdomen: soft, nontender   Vulva: normal  Vagina: normal vagina  Cervix:  closed  Corpus: Well-involuted  Adnexa:  Non-palpable   Depression screen [facility] 2/9 04/13/2019 03/25/2018  Decreased Interest 0 -  Down, Depressed, Hopeless 0 (No Data)  PHQ - 2 Score 0 -  Altered sleeping 0 -  Tired, decreased energy 1 -  Change in appetite 0 -  Feeling bad or failure about yourself  0 -  Trouble concentrating 0 -  Moving slowly or fidgety/restless 0 -  Suicidal thoughts 0 -  PHQ-9 Score 1 -  Difficult doing work/chores Not difficult at all -        Assessment:    Postpartum exam Six (6) wks s/p spontaneous vaginal birth Breastfeeding Depression screening Contraception counseling   Plan:   Flu shot given, see chart.   Referral to lactation for back to work breastfeeding and pumping education, see orders.   Encouraged routine health maintenance.   Reviewed red flag symptoms and when to call.   Follow up in: 4 weeks for Mirena insertion or earlier if needed.   Diona Fanti, CNM [facility], [facility] 04/13/19 6:09 PM

## 2019-04-13 NOTE — Progress Notes (Signed)
Patient here for post-partum visit, no complaints. Flu injection given.

## 2019-04-20 ENCOUNTER — Ambulatory Visit: Payer: Self-pay

## 2019-04-20 NOTE — Lactation Note (Signed)
This note was copied from a baby's chart. Lactation Consultation Note  Patient Name: Shelia Daniels OZHYQ'MToday's Date: 04/20/2019   Shelia ShutterBaby Shelia Daniels and mom Shelia Daniels came in for outpatient lactation consult due to what was being called a difficult latch.  Mom reported early in the first week of her breastfeeding journey she experienced mastitis on her right breast, but has no complaints today of any soreness or discomfort. Mom reports that in the last few days Shelia Daniels has begun to come on and off the breast, and continues to tuck both her top and bottom lips. Pediatricians have stated that her weight gain has been slower than they would like, and they have had several visits for weight checks with one lactation support through their pediatrics office before changing providers. Today a pre-weight was taken before Shelia Daniels began to feed at 9lb13.3oz. Mom placed Shelia Daniels in cross cradle position on left breast. Shelia Salvagemilia was eager to grasp the breast, once latched she immediately began gulping and having difficulty keeping the latch. Shelia Daniels had several larger swallows early on in her feeding on the left breast, possibly due to a fast flow. LC notes that her top lip was diffcult to flange out, even after multiple attempts by both mom and the LC. The bottom lip was able to flange, with assistance and did stay out prior to her coming off the breast. Mom was encouraged to hold her breast in a c-hold to provide some stabilization and support for baby at the breast, baby did appear to be more comfortable with the additional support and maintained latch for a longer period before coming off again. Mom's nipple was erect both at rest and after baby came off the breast. Shelia Daniels fed for 5-7 minutes. Post weight after left breast was 9lb14.0oz Discussed with mom different positions, mom gave Shelia Daniels a change to display hunger cues again before offering right breast. Shelia Daniels once again was eager to grasp the breast, but had difficulty with  flanged top and bottom lips, mom was encouraged to support her breast with a c-hold, and Shelia Daniels immediately seemed more comfortable and was able to better sustain her latch for a longer duration. Shelia Daniels fed for 10 minutes on right breast with a post weight of 9lb15.2oz. Total transfer of approximately 2oz in 15 minutes. LC and mom discussed the upright position to help with both sustaining latch and flow of milk, LC assisted with mom putting baby in upright position, and getting comfortable. Shelia Daniels re-latched without difficulty and fed for additional 5-7 minutes. Shelia Daniels displayed a better flanged bottom lip, and wider mouth, but continued to have top lip tucked in. LC provided information for possibility of a tight top lip or a possible slight lip restriction that could be preventing Shelia Daniels from getting a proper seal on the breast causing her to be unable to sustain her latch, and potentially work harder at the breast. Pediatric dentist information given for Shelia Daniels and Shelia Daniels for possible correction and or guidance on appropriate stretches/massages to the tissue. Mom also had concerns about returning to work and establishing a pumping routine. Mom has the Spectra EBP. LC viewed the parts/pieces, and explained how to choose the appropriate flange fit, and tips for suction, and use of coconut oil to minimize friction against the skin. Discussed starting a pumping routine of 2x/day post feeds to get body acclimated to use of pump, and establish a stored supply. Provided guidance on bottle acceptance from infant- use of dad or other support person while mom is not  in the room. Mom's goal is continue with exclusive breastfeeding, changing positions from cross cradle/cradle to upright when needed, use of c-hold to give more support for baby, follow-up with pediatrician or consider consultations with pediatric dentists in the area. She will begin pumping routine this week in preparation for returning to work on October  19th. [facility] department contact information given for additional questions or concerns. Maternal Data    Feeding    LATCH Score                   Interventions    Lactation Tools Discussed/Used     Consult Status      Shelia Daniels 04/20/2019, 4:05 PM

## 2019-05-06 ENCOUNTER — Ambulatory Visit (INDEPENDENT_AMBULATORY_CARE_PROVIDER_SITE_OTHER): Payer: Managed Care, Other (non HMO) | Admitting: Certified Nurse Midwife

## 2019-05-06 ENCOUNTER — Other Ambulatory Visit: Payer: Self-pay

## 2019-05-06 ENCOUNTER — Encounter: Payer: Self-pay | Admitting: Certified Nurse Midwife

## 2019-05-06 VITALS — BP 108/71 | HR 71 | Ht 66.0 in | Wt 238.2 lb

## 2019-05-06 DIAGNOSIS — Z30430 Encounter for insertion of intrauterine contraceptive device: Secondary | ICD-10-CM

## 2019-05-06 DIAGNOSIS — R102 Pelvic and perineal pain: Secondary | ICD-10-CM

## 2019-05-06 MED ORDER — VITAMIN D (ERGOCALCIFEROL) 1.25 MG (50000 UNIT) PO CAPS: 50000.0000 [IU] | ORAL_CAPSULE | ORAL | 3 refills | Status: DC

## 2019-05-06 NOTE — Progress Notes (Signed)
Pt present for iud mirena insertion. Pt's UPT-neg. Pt stated that she was doing well no problems.

## 2019-05-06 NOTE — Progress Notes (Signed)
Shelia Daniels is a 33 y.o. year old G69P1001 Caucasian female who presents for placement of a Mirena IUD.  BP 108/71   Pulse 71   Ht 5\' 6"  (1.676 m)   Wt 238 lb 3.2 oz (108 kg)   LMP  (LMP Unknown)   BMI 38.45 kg/m   Pregnancy test today was negative.   The risks and benefits of the method and placement have been thouroughly reviewed with the patient and all questions were answered.  Specifically the patient is aware of failure rate of 08/998, expulsion of the IUD and of possible perforation.  The patient is aware of irregular bleeding due to the method and understands the incidence of irregular bleeding diminishes with time.  Signed copy of informed consent in chart.   Time out was performed.  A small plastic speculum was placed in the vagina.  The cervix was visualized, prepped using Betadine, and grasped with a single tooth tenaculum. The uterus was sounded to 10 cm.  Mirena IUD placed per manufacturer's recommendations.   The strings were trimmed to 3 cm.  The patient was given post procedure instructions, including signs and symptoms of infection and to check for the strings after each menses or each month, and refraining from intercourse or anything in the vagina for 3 days.  She was given a Mirena care card with date Mirena placed, and date Mirena to be removed.  Reviewed red flag symptoms and when to call.   RTC x 6-8 weeks for string check or sooner if needed.    Diona Fanti, CNM [facility], [facility] 05/06/19 3:04 PM   NDC: 95188-416-60 Lot: [id number] Exp: 06/2021

## 2019-05-06 NOTE — Patient Instructions (Addendum)
IUD PLACEMENT POST-PROCEDURE INSTRUCTIONS  1. You may take Ibuprofen, Aleve or Tylenol for pain if needed.  Cramping should resolve within in 24 hours.  2. You may have a small amount of spotting.  You should wear a mini pad for the next few days.  3. You may have intercourse after 72 hours.  If you using this for birth control, it is effective immediately.  4. You need to call if you have any pelvic pain, fever, heavy bleeding or foul smelling vaginal discharge.  Irregular bleeding is common the first several months after having an IUD placed. You do not need to call for this reason unless you are concerned.  5. Shower or bathe as normal  6. You should have a follow-up appointment in 4-8 weeks for a re-check to make sure you are not having any problems.   Levonorgestrel intrauterine device (IUD) What is this medicine? LEVONORGESTREL IUD (LEE voe nor jes trel) is a contraceptive (birth control) device. The device is placed inside the uterus by a healthcare professional. It is used to prevent pregnancy. This device can also be used to treat heavy bleeding that occurs during your period. This medicine may be used for other purposes; ask your health care provider or pharmacist if you have questions. COMMON BRAND NAME(S): Kyleena, LILETTA, Mirena, Skyla What should I tell my health care provider before I take this medicine? They need to know if you have any of these conditions:  abnormal Pap smear  cancer of the breast, uterus, or cervix  diabetes  endometritis  genital or pelvic infection now or in the past  have more than one sexual partner or your partner has more than one partner  heart disease  history of an ectopic or tubal pregnancy  immune system problems  IUD in place  liver disease or tumor  problems with blood clots or take blood-thinners  seizures  use intravenous drugs  uterus of unusual shape  vaginal bleeding that has not been explained  an unusual or  allergic reaction to levonorgestrel, other hormones, silicone, or polyethylene, medicines, foods, dyes, or preservatives  pregnant or trying to get pregnant  breast-feeding How should I use this medicine? This device is placed inside the uterus by a health care professional. Talk to your pediatrician regarding the use of this medicine in children. Special care may be needed. Overdosage: If you think you have taken too much of this medicine contact a poison control center or emergency room at once. NOTE: This medicine is only for you. Do not share this medicine with others. What if I miss a dose? This does not apply. Depending on the brand of device you have inserted, the device will need to be replaced every 3 to 6 years if you wish to continue using this type of birth control. What may interact with this medicine? Do not take this medicine with any of the following medications:  amprenavir  bosentan  fosamprenavir This medicine may also interact with the following medications:  aprepitant  armodafinil  barbiturate medicines for inducing sleep or treating seizures  bexarotene  boceprevir  griseofulvin  medicines to treat seizures like carbamazepine, ethotoin, felbamate, oxcarbazepine, phenytoin, topiramate  modafinil  pioglitazone  rifabutin  rifampin  rifapentine  some medicines to treat HIV infection like atazanavir, efavirenz, indinavir, lopinavir, nelfinavir, tipranavir, ritonavir  St. John's wort  warfarin This list may not describe all possible interactions. Give your health care provider a list of all the medicines, herbs, non-prescription drugs, or   dietary supplements you use. Also tell them if you smoke, drink alcohol, or use illegal drugs. Some items may interact with your medicine. What should I watch for while using this medicine? Visit your doctor or health care professional for regular check ups. See your doctor if you or your partner has sexual  contact with others, becomes HIV positive, or gets a sexual transmitted disease. This product does not protect you against HIV infection (AIDS) or other sexually transmitted diseases. You can check the placement of the IUD yourself by reaching up to the top of your vagina with clean fingers to feel the threads. Do not pull on the threads. It is a good habit to check placement after each menstrual period. Call your doctor right away if you feel more of the IUD than just the threads or if you cannot feel the threads at all. The IUD may come out by itself. You may become pregnant if the device comes out. If you notice that the IUD has come out use a backup birth control method like condoms and call your health care provider. Using tampons will not change the position of the IUD and are okay to use during your period. This IUD can be safely scanned with magnetic resonance imaging (MRI) only under specific conditions. Before you have an MRI, tell your healthcare provider that you have an IUD in place, and which type of IUD you have in place. What side effects may I notice from receiving this medicine? Side effects that you should report to your doctor or health care professional as soon as possible:  allergic reactions like skin rash, itching or hives, swelling of the face, lips, or tongue  fever, flu-like symptoms  genital sores  high blood pressure  no menstrual period for 6 weeks during use  pain, swelling, warmth in the leg  pelvic pain or tenderness  severe or sudden headache  signs of pregnancy  stomach cramping  sudden shortness of breath  trouble with balance, talking, or walking  unusual vaginal bleeding, discharge  yellowing of the eyes or skin Side effects that usually do not require medical attention (report to your doctor or health care professional if they continue or are bothersome):  acne  breast pain  change in sex drive or performance  changes in  weight  cramping, dizziness, or faintness while the device is being inserted  headache  irregular menstrual bleeding within first 3 to 6 months of use  nausea This list may not describe all possible side effects. Call your doctor for medical advice about side effects. You may report side effects to FDA at 1-800-FDA-1088. Where should I keep my medicine? This does not apply. NOTE: This sheet is a summary. It may not cover all possible information. If you have questions about this medicine, talk to your doctor, pharmacist, or health care provider.  2020 Elsevier/Gold Standard (2018-06-02 13:22:01)  

## 2019-05-12 ENCOUNTER — Ambulatory Visit: Payer: 59 | Admitting: Physical Therapy

## 2019-05-13 ENCOUNTER — Encounter: Payer: Self-pay | Admitting: Physical Therapy

## 2019-05-17 ENCOUNTER — Other Ambulatory Visit: Payer: Self-pay

## 2019-05-17 ENCOUNTER — Ambulatory Visit: Payer: 59 | Attending: Certified Nurse Midwife | Admitting: Physical Therapy

## 2019-05-17 ENCOUNTER — Encounter: Payer: Self-pay | Admitting: Physical Therapy

## 2019-05-17 DIAGNOSIS — R278 Other lack of coordination: Secondary | ICD-10-CM | POA: Diagnosis present

## 2019-05-17 DIAGNOSIS — M533 Sacrococcygeal disorders, not elsewhere classified: Secondary | ICD-10-CM

## 2019-05-17 DIAGNOSIS — M62838 Other muscle spasm: Secondary | ICD-10-CM | POA: Diagnosis present

## 2019-05-17 NOTE — Patient Instructions (Addendum)
childs pose stretch on forearms, toes tucked 10 reps    Happy baby    ___   [place] the day   foot on seat of chair, 1) wide angle hip flexor, ( back knee soft bend)  Rock forward and back 10 reps   2) line up L  Knee with L hip  R arm up  Rock forward and back 10 reps   2b) Last rep, leaning forward L hand on  R thigh, R hand on shoulder, backward circle rolls 10 reps    ___  Sleeping with pillow between knees when sidelying    Avoid straining pelvic floor, abdominal muscles , spine  Use log rolling technique instead of getting out of bed with your neck or the sit-up     Log rolling into and out of bed   Log rolling into and out of bed If getting out of bed on R side, Bent knees, scoot hips/ shoulder to L  Raise R arm completely overhead, rolling onto armpit  Then lower bent knees to bed to get into complete side lying position  Then drop legs off bed, and push up onto R elbow/forearm, and use L hand to push onto the bed

## 2019-05-17 NOTE — Therapy (Signed)
[place] [facility] [address] [place], [place], [address] Phone: [phone]   Fax:  [phone]  Physical Therapy Evaluation  Patient Details  Name: Shelia Daniels MRN: [id number] Date of Birth: 1985/10/31 Referring Provider (PT): Dani Gobble    Encounter Date: 05/17/2019  PT End of Session - 05/17/19 1827    Visit Number  1    Number of Visits  10    PT Start Time  1702    PT Stop Time  1800    PT Time Calculation (min)  58 min       Past Medical History:  Diagnosis Date  . Family history of breast cancer   . Genetic testing 12/2015   My Risk/BRCA neg  . Increased risk of breast cancer 12/2015   IBIS=21.7%    Past Surgical History:  Procedure Laterality Date  . INTRAUTERINE DEVICE (IUD) INSERTION  05/2014   Mirena    There were no vitals filed for this visit.   Subjective Assessment - 05/17/19 1710    Subjective  Pt is s/p 10.5 weeks after the delivery of her first child who weighed 8 lbs. Pt had a 3rd degree perineal tear. Pt's midwife has told her that it has fully healed.  Pelvic pain/ pressure / burning feeling at the perineum with relaxing positions and sitting for longer 30 min in softer chairs.  Low abdominal pain occurs with sitting  up after lying down. Denied urinary/ fecal issues, tailbone pain, falls onto tailbone.  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    Patient Stated Goals  to not have discomfort when trying to relax         [facility] PT Assessment - 05/17/19 1725      Assessment   Medical Diagnosis  perineal tear 3rd     Referring Provider (PT)  Dani Gobble       Precautions   Precautions  None      Restrictions   Weight Bearing Restrictions  No      Balance Screen   Has the patient fallen in the past 6 months  No      Observation/Other Assessments   Observations  L shoulder slightly lowered  than R       AROM   Overall AROM Comments  L rotation < R.       PROM   Overall PROM Comments  L endrange hip flexion with groin pain L.  ( post Tx: no pain at end range hip flexion)       Strength   Overall Strength Comments  BLE 5/5 , hip abd not tested due to limited hip ext 2/2 pain      Palpation   SI assessment   Levelled ASIS,   flinching pain with palpation at sacrum S2-3 level B , limited in nutation, hypomobile ( ;post Tx increased mobility, no flinching pain)     Palpation comment  tightness at glut max B       Bed Mobility   Bed Mobility  --   head lifting  , cued for log rolling    Rolling Right  --   no low abdominal pain w/ cue to not lift head                Objective measurements completed on examination: See above findings.    Pelvic Floor Special Questions - 05/17/19 1735  Diastasis Recti  neg    External Perineal Exam  through clothing     External Palpation  tightness but with no tenderness at anterior / posterior mm B         [facility] Adult PT Treatment/Exercise - 05/17/19 1828      Bed Mobility   Bed Mobility  --   head lifting  , cued for log rolling    Rolling Right  --   no low abdominal pain w/ cue to not lift head      Neuro Re-ed    Neuro Re-ed Details   cued for no hyperextension of knees, bed rolling without headlift to minimize pain,  details to HEP        Manual Therapy   Manual therapy comments  long axis distraction BLE, AP mob FADDIR/ MWM/ STM rotational mob to promote sacral nutation and hip ext B                   PT Long Term Goals - 05/17/19 1717      PT LONG TERM GOAL #1   Title  Pt will report no burning/ pressure feeling when in relaxing positions and sitting for longer 30 min in softer chairs in order to improve of QOL and breastfeed baby    Time  8    Period  Weeks    Status  New    Target Date  07/12/19      PT LONG TERM GOAL #2   Title  Pt will demo proper body mechanics to minimize straining  of abdomen and pelvic floor with fitness routine (modifications to sit-up/ crunches) ( lifting dumbbells, kettlebells, stand<> floor and sit to stand t/f and lifting 40 lb back bag from ground to shoulders)    Time  10    Period  Weeks    Status  New    Target Date  07/26/19      PT LONG TERM GOAL #3   Title  Pt will demo decreased perineal scar mobility in order to optimize deep core coordination and minimize pain    Time  4    Period  Weeks    Status  New    Target Date  06/14/19      PT LONG TERM GOAL #4   Title  Pt will increase her PSFS score for relaxing on soft chair 5 pt to > 8pts, and rising with no pain / discomfort 7 pts to 10 pts in order to perform ADLs    Time  10    Period  Weeks    Status  New    Target Date  07/26/19             Plan - 05/17/19 1710    Clinical Impression Statement  Pt is s/p 10.5 weeks the delivery of her first and only child and incurred 3rd perineal tear.  Pt reports of pelvic pain/ pressure / burning feeling at the perineum with relaxing positions and sitting for longer 30 min in softer chairs.  Low abdominal pain occurs with sitting  up after lying down. Denied urinary/ fecal issues, tailbone pain, falls onto tailbone. 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 Following Tx today, pt demo'd no more flinching pain with palpation of sacrum into nutation position/ hip extension and regained more mobility in the SIJ /  hip. Pt demo'd proper logrolling technique which decreased low abdominal pain. Plan to address perineal scar restrictions at next session.  Pt benefits from skilled pelvic health PT.      Examination-Activity Limitations  Sit    Stability/Clinical Decision Making  Stable/Uncomplicated    Clinical Decision Making  Low    Rehab Potential  Good    PT Frequency  1x  / week    PT Duration  --   10   PT Treatment/Interventions  Neuromuscular re-education;Moist Heat;Patient/family education;Therapeutic exercise;Therapeutic activities;Gait training;Stair training;Taping;Manual techniques;Balance training;Scar mobilization;Energy conservation    Consulted and Agree with Plan of Care  Patient       Patient will benefit from skilled therapeutic intervention in order to improve the following deficits and impairments:  Improper body mechanics, Pain, Increased muscle spasms, Postural dysfunction, Hypomobility, Hypermobility, Difficulty walking, Decreased range of motion, Decreased endurance, Decreased coordination, Decreased mobility, Decreased activity tolerance, Decreased safety awareness  Visit Diagnosis: Other muscle spasm  Sacrococcygeal disorders, not elsewhere classified  Other lack of coordination     Problem List Patient Active Problem List   Diagnosis Date Noted  . Care and examination of lactating mother 04/13/2019  . Type 3a perineal laceration during delivery 02/24/2019  . Prediabetes 01/30/2018  . Increased risk of breast cancer 12/24/2016  . Family history of breast cancer 12/24/2016  . Breast mass, right 02/09/2016    Jerl Mina ,PT, DPT, E-RYT  05/17/2019, 6:35 PM  [place] [facility] SERVICES [address] [place], [place], [address] Phone: [phone]   Fax:  [phone]  Name: Shelia Daniels MRN: [id number] Date of Birth: 18-May-1986

## 2019-06-02 ENCOUNTER — Ambulatory Visit: Payer: 59 | Admitting: Physical Therapy

## 2019-06-03 ENCOUNTER — Other Ambulatory Visit: Payer: Self-pay

## 2019-06-03 ENCOUNTER — Ambulatory Visit: Payer: 59 | Admitting: Physical Therapy

## 2019-06-03 DIAGNOSIS — M62838 Other muscle spasm: Secondary | ICD-10-CM

## 2019-06-03 DIAGNOSIS — R278 Other lack of coordination: Secondary | ICD-10-CM

## 2019-06-03 DIAGNOSIS — M533 Sacrococcygeal disorders, not elsewhere classified: Secondary | ICD-10-CM

## 2019-06-03 NOTE — Patient Instructions (Signed)
  CAN CAN   _Figure-4 and then toe touch to the ground behind you along a diagonal    ___   Stretch for pelvic floor   "v heels slide away and then back toward buttocks and then rock knee to slight ,  slide heel along at 11 o clock away from buttocks   10 reps

## 2019-06-04 NOTE — Therapy (Addendum)
[place] [facility] SERVICES [address] [place], [place], [address] Phone: [phone]   Fax:  [phone]  Physical Therapy Treatment  Patient Details  Name: Shelia Daniels MRN: [id number] Date of Birth: [birth year] Referring Provider (PT): Dani Gobble    Encounter Date: 06/03/2019    Past Medical History:  Diagnosis Date  . Family history of breast cancer   . Genetic testing 12/2015   My Risk/BRCA neg  . Increased risk of breast cancer 12/2015   IBIS=21.7%    Past Surgical History:  Procedure Laterality Date  . INTRAUTERINE DEVICE (IUD) INSERTION  05/2014   Mirena    There were no vitals filed for this visit.  Subjective Assessment - 06/03/19 1605    Subjective  Pt reports she felt looser in the area where she had been wanting someone to work on for 3 years.    Patient Stated Goals  to not have discomfort when trying to relax         [facility] PT Assessment - 06/03/19 1758      PROM   Overall PROM Comments  FADDIR on B less groin pinching and more ROM post Tx                  Pelvic Floor Special Questions - 06/03/19 1757    External Perineal Exam  external palpation without undergarments     External Palpation  tightness at B ischioanal fossa, ischial rami, tuberosity, L Coccygeus,. sacroiliac iligament , glut med         [facility] Adult PT Treatment/Exercise - 06/04/19 0809      Exercises   Exercises  --   see pt instructions to promote pelvic floor stretches      Modalities   Modalities  Moist Heat      Moist Heat Therapy   Moist Heat Location  --   during relaxation practice     Manual Therapy   Manual therapy comments  long axis distraction B, STM/MWM to decreased tightness at ischial rami  B, L coccygeus/ sacroiliac ligament, glut med L                    PT Long Term Goals - 05/17/19 1717      PT LONG TERM GOAL #1   Title  Pt will report no burning/ pressure feeling when in  relaxing positions and sitting for longer 30 min in softer chairs in order to improve of QOL and breastfeed baby    Time  8    Period  Weeks    Status  New    Target Date  07/12/19      PT LONG TERM GOAL #2   Title  Pt will demo proper body mechanics to minimize straining of abdomen and pelvic floor with fitness routine (modifications to sit-up/ crunches) ( lifting dumbbells, kettlebells, stand<> floor and sit to stand t/f and lifting 40 lb back bag from ground to shoulders)    Time  10    Period  Weeks    Status  New    Target Date  07/26/19      PT LONG TERM GOAL #3   Title  Pt will demo decreased perineal scar mobility in order to optimize deep core coordination and minimize pain    Time  4    Period  Weeks    Status  New    Target Date  06/14/19      PT LONG  TERM GOAL #4   Title  Pt will increase her PSFS score for relaxing on soft chair 5 pt to > 8pts, and rising with no pain / discomfort 7 pts to 10 pts in order to perform ADLs    Time  10    Period  Weeks    Status  New    Target Date  07/26/19            Plan - 06/04/19 3437    Clinical Impression Statement SIJ mobility is improving. Pt achieved no pinching pain with hip flex/adductor/ IR bilaterally post Tx. Pt tolerated manual Tx without complaints. Plan to address pelvic floor perineal scar at upcoming sessions with external techniques.  Pt will continue to benefit from skilled PT.    Examination-Activity Limitations  Sit    Stability/Clinical Decision Making  Stable/Uncomplicated    Rehab Potential  Good    PT Frequency  1x / week    PT Duration  --   10   PT Treatment/Interventions  Neuromuscular re-education;Moist Heat;Patient/family education;Therapeutic exercise;Therapeutic activities;Gait training;Stair training;Taping;Manual techniques;Balance training;Scar mobilization;Energy conservation    Consulted and Agree with Plan of Care  Patient       Patient will benefit from skilled therapeutic intervention  in order to improve the following deficits and impairments:  Improper body mechanics, Pain, Increased muscle spasms, Postural dysfunction, Hypomobility, Hypermobility, Difficulty walking, Decreased range of motion, Decreased endurance, Decreased coordination, Decreased mobility, Decreased activity tolerance, Decreased safety awareness  Visit Diagnosis: Sacrococcygeal disorders, not elsewhere classified  Other muscle spasm  Other lack of coordination     Problem List Patient Active Problem List   Diagnosis Date Noted  . Care and examination of lactating mother 04/13/2019  . Type 3a perineal laceration during delivery 02/24/2019  . Prediabetes 01/30/2018  . Increased risk of breast cancer 12/24/2016  . Family history of breast cancer 12/24/2016  . Breast mass, right 02/09/2016    Jerl Mina ,PT, DPT, E-RYT  06/04/2019, 8:11 AM  [place] [facility] SERVICES [address] [place], [place], [address] Phone: [phone]   Fax:  [phone]  Name: Kenesha Moshier MRN: [id number] Date of Birth: 01-07-1986

## 2019-06-09 ENCOUNTER — Ambulatory Visit: Payer: 59 | Admitting: Physical Therapy

## 2019-06-10 ENCOUNTER — Ambulatory Visit: Payer: 59 | Attending: Certified Nurse Midwife | Admitting: Physical Therapy

## 2019-06-10 ENCOUNTER — Other Ambulatory Visit: Payer: Self-pay

## 2019-06-10 DIAGNOSIS — R278 Other lack of coordination: Secondary | ICD-10-CM | POA: Diagnosis present

## 2019-06-10 DIAGNOSIS — M533 Sacrococcygeal disorders, not elsewhere classified: Secondary | ICD-10-CM | POA: Insufficient documentation

## 2019-06-10 DIAGNOSIS — M62838 Other muscle spasm: Secondary | ICD-10-CM | POA: Insufficient documentation

## 2019-06-10 NOTE — Patient Instructions (Addendum)
Discontinue past exercise  Feet care :  Self -feet massage   Handshake : fingers between toes, moving ballmounds/toes back and forth several times while other hand anchors at arch. Do the same at the hind/mid foot.  Heel to toes upward to a letter Big Letter T strokes to spread ballmounds and toes, several times, pinch between webs of toes  Run finger tips along top of foot between long bones "comb between the bones"    Wiggle toes and spread them out when relaxing   __  While breastfeeding, with feet on ground,  Perfect toe inserted on both feet Heel raises and focus on lowering heel with wider ballmounds  2 min   __  Walking with more ballmounds and less heel striking, longer stride length   feet placed hip width apart

## 2019-06-10 NOTE — Therapy (Addendum)
[place] MAIN [facility] SERVICES [address] [place], [place], [address] Phone: [phone]   Fax:  [phone]  Physical Therapy Treatment  Patient Details  Name: Shelia Daniels MRN: [id number] Date of Birth: [birth year] Referring Provider (PT): Dani Gobble    Encounter Date: 06/10/2019  PT End of Session - 06/10/19 1612    Visit Number  3    Number of Visits  10    PT Start Time  1610    PT Stop Time  1705    PT Time Calculation (min)  55 min    Activity Tolerance  Patient tolerated treatment well    Behavior During Therapy  [facility] for tasks assessed/performed       Past Medical History:  Diagnosis Date  . Family history of breast cancer   . Genetic testing 12/2015   My Risk/BRCA neg  . Increased risk of breast cancer 12/2015   IBIS=21.7%    Past Surgical History:  Procedure Laterality Date  . INTRAUTERINE DEVICE (IUD) INSERTION  05/2014   Mirena    There were no vitals filed for this visit.  Subjective Assessment - 06/10/19 1611    Subjective  Pt has been placing her feet on the ground and changed her breast feeding position. Pt has been getting bits and pieces of her HEP into the work and after work. Pt felt good for 2-3 days in the back. "I feel a difference when she sits for a while that her pressure is less frequent".  Pt feels more mobility and less stiff when getting up from the couch or chair. Yesterday/today, the sitffness came back.    Patient Stated Goals  to not have discomfort when trying to relax         [facility] PT Assessment - 06/10/19 1659      Observation/Other Assessments   Observations  R hallus valgus       Ambulation/Gait   Gait Comments  heel striking, short strides,       Strength: ankle DF/EV 3+/5 R, L 4/5  ( post Tx: R 4/5)             Pelvic Floor Special Questions - 06/10/19 1654    External Perineal Exam  external palpation with clothings    External Palpation  R ischicavernosus and  adductors. Improved more pelvic floor lengthening post Tx ( post Tx decreased )          [facility] Adult PT Treatment/Exercise - 06/10/19 1658      Neuro Re-ed    Neuro Re-ed Details   cued for more anterior COM, more co-activation of transverse arch, less heel striking         Manual Therapy   Manual therapy comments  long axis distraction on R, adductor STM, PA/AP mob at fore/midfoot, STM transverse head of adductor hallucis                   PT Long Term Goals - 05/17/19 1717      PT LONG TERM GOAL #1   Title  Pt will report no burning/ pressure feeling when in relaxing positions and sitting for longer 30 min in softer chairs in order to improve of QOL and breastfeed baby    Time  8    Period  Weeks    Status  New    Target Date  07/12/19      PT LONG TERM GOAL #2   Title  Pt will demo  proper body mechanics to minimize straining of abdomen and pelvic floor with fitness routine (modifications to sit-up/ crunches) ( lifting dumbbells, kettlebells, stand<> floor and sit to stand t/f and lifting 40 lb back bag from ground to shoulders)    Time  10    Period  Weeks    Status  New    Target Date  07/26/19      PT LONG TERM GOAL #3   Title  Pt will demo decreased perineal scar mobility in order to optimize deep core coordination and minimize pain    Time  4    Period  Weeks    Status  New    Target Date  06/14/19      PT LONG TERM GOAL #4   Title  Pt will increase her PSFS score for relaxing on soft chair 5 pt to > 8pts, and rising with no pain / discomfort 7 pts to 10 pts in order to perform ADLs    Time  10    Period  Weeks    Status  New    Target Date  07/26/19            Plan - 06/10/19 1702    Clinical Impression Statement  Pt no longer showed L posterior pelvic floor tightness. Today, worked on decreasing R ischiocavernosus and adductor, instrinsic foot mm tightness today with manual Tx which pt tolerated. This R sided deficit along lower kinetic chain is  associated with pt's hallus valgus on 1st metatarsal. Pt demo'd improved pelvic floor lengthening and increased ankle DF/EV strength which will help with minimizing the R adductor/ ischiocavernosus mm tightness.  Pt required cues for more co-activationof deep core and transverse arch in gait to increase SIJ mobility, decrease heel striking, and increasing stride length.  Regional interdependent approaches will remain neccessary to help pt achieve her goals. Pt continues to benefit skilled PT.    Examination-Activity Limitations  Sit    Stability/Clinical Decision Making  Stable/Uncomplicated    Rehab Potential  Good    PT Frequency  1x / week    PT Duration  --   10   PT Treatment/Interventions  Neuromuscular re-education;Moist Heat;Patient/family education;Therapeutic exercise;Therapeutic activities;Gait training;Stair training;Taping;Manual techniques;Balance training;Scar mobilization;Energy conservation    Consulted and Agree with Plan of Care  Patient       Patient will benefit from skilled therapeutic intervention in order to improve the following deficits and impairments:  Improper body mechanics, Pain, Increased muscle spasms, Postural dysfunction, Hypomobility, Hypermobility, Difficulty walking, Decreased range of motion, Decreased endurance, Decreased coordination, Decreased mobility, Decreased activity tolerance, Decreased safety awareness  Visit Diagnosis: Sacrococcygeal disorders, not elsewhere classified  Other muscle spasm  Other lack of coordination     Problem List Patient Active Problem List   Diagnosis Date Noted  . Care and examination of lactating mother 04/13/2019  . Type 3a perineal laceration during delivery 02/24/2019  . Prediabetes 01/30/2018  . Increased risk of breast cancer 12/24/2016  . Family history of breast cancer 12/24/2016  . Breast mass, right 02/09/2016    Jerl Mina ,PT, DPT, E-RYT  06/10/2019, 5:09 PM  [place] MAIN [facility] SERVICES [address] [place], [place], [address] Phone: [phone]   Fax:  [phone]  Name: Shelia Daniels MRN: [id number] Date of Birth: May 06, 1986

## 2019-06-16 ENCOUNTER — Ambulatory Visit: Payer: 59 | Admitting: Physical Therapy

## 2019-06-17 ENCOUNTER — Ambulatory Visit: Payer: 59 | Admitting: Physical Therapy

## 2019-06-17 ENCOUNTER — Other Ambulatory Visit: Payer: Self-pay

## 2019-06-17 DIAGNOSIS — M533 Sacrococcygeal disorders, not elsewhere classified: Secondary | ICD-10-CM

## 2019-06-17 DIAGNOSIS — M62838 Other muscle spasm: Secondary | ICD-10-CM

## 2019-06-17 DIAGNOSIS — R278 Other lack of coordination: Secondary | ICD-10-CM

## 2019-06-17 NOTE — Therapy (Signed)
[place] MAIN [facility] SERVICES [address] [place], [place], [address] Phone: [phone]   Fax:  [phone]  Physical Therapy Treatment  Patient Details  Name: Shelia Daniels MRN: [id number] Date of Birth: 28-May-1986 Referring Provider (PT): Dani Gobble    Encounter Date: 06/17/2019  PT End of Session - 06/17/19 1807    Visit Number  4    Number of Visits  10    PT Start Time  1600    PT Stop Time  1027    PT Time Calculation (min)  58 min    Activity Tolerance  Patient tolerated treatment well    Behavior During Therapy  [facility] for tasks assessed/performed       Past Medical History:  Diagnosis Date  . Family history of breast cancer   . Genetic testing 12/2015   My Risk/BRCA neg  . Increased risk of breast cancer 12/2015   IBIS=21.7%    Past Surgical History:  Procedure Laterality Date  . INTRAUTERINE DEVICE (IUD) INSERTION  05/2014   Mirena    There were no vitals filed for this visit.  Subjective Assessment - 06/17/19 1603    Subjective  Pt did her stretches. Pt reports changing her stride and walking different takes alot of focus    Patient Stated Goals  to not have discomfort when trying to relax         [facility] PT Assessment - 06/17/19 1808      Coordination   Coordination and Movement Description  pelvic tilt with overuse of glut/ adductors       PROM   Overall PROM Comments  pinching pain at R groin pre tx in end range hip flexion in supine  ( post Tx: no pinching pain)       Palpation   Palpation comment  increased tightness at ischial rami B ( decreased postTx)                 Pelvic Floor Special Questions - 06/17/19 1808    External Perineal Exam  external palpation without clothings    External Palpation  B ischiocavernosus/ bulbospongiosus  / deep transverse perineal        [facility] Adult PT Treatment/Exercise - 06/17/19 1809      Neuro Re-ed    Neuro Re-ed Details   cued for deep  core level 1 and 2 , pelvic tilts without overuse of gluts / adductors. cued for gait training with more hip flexion/ more co-activation of deep core. Cued for standing position with less hyperextension of knees       Moist Heat Therapy   Number Minutes Moist Heat  5 Minutes    Moist Heat Location  --   perineum/ pillow case / sheet      Manual Therapy   Manual therapy comments  external: STM/MWM at anterior pelvic floor mm noted in assessment, ischial rami B, supine: thomas stretch to minimize pinching pain at R groin and promote anterior rotation of femural head                   PT Long Term Goals - 06/17/19 1609      PT LONG TERM GOAL #1   Title  Pt will report no burning/ pressure feeling when in relaxing positions and sitting for longer 30 min in softer chairs in order to improve of QOL and breastfeed baby    Time  8    Period  Weeks  Status  Achieved      PT LONG TERM GOAL #2   Title  Pt will demo proper body mechanics to minimize straining of abdomen and pelvic floor with fitness routine (modifications to sit-up/ crunches) ( lifting dumbbells, kettlebells, stand<> floor and sit to stand t/f and lifting 40 lb back bag from ground to shoulders)    Time  10    Period  Weeks    Status  On-going      PT LONG TERM GOAL #3   Title  Pt will demo decreased perineal scar mobility in order to optimize deep core coordination and minimize pain    Time  4    Period  Weeks    Status  On-going      PT LONG TERM GOAL #4   Title  Pt will increase her PSFS score for relaxing on soft chair 5 pt to > 8pts, and rising with no pain / discomfort 7 pts to 10 pts in order to perform ADLs    Time  10    Period  Weeks    Status  On-going            Plan - 06/17/19 1812    Clinical Impression Statement Pt is showing increased SIJ mobility but still reported R pinching groin pain in end range hip flexion. Post Tx, pt had no more pinching R groin pain. Pt benefited from external  manual Tx that decreased anterior pelvic floor mm and mm at ischial rami B. Provided education for standing posture to minimize hyperextension of knees and tightening of anterior pelvic floor mm. Pt has been IND with self- correcting with feet propioception in sitting posture without cues. Pt continues to benefit from skilled PT.    Examination-Activity Limitations  Sit    Stability/Clinical Decision Making  Stable/Uncomplicated    Rehab Potential  Good    PT Frequency  1x / week    PT Duration  --   10   PT Treatment/Interventions  Neuromuscular re-education;Moist Heat;Patient/family education;Therapeutic exercise;Therapeutic activities;Gait training;Stair training;Taping;Manual techniques;Balance training;Scar mobilization;Energy conservation    Consulted and Agree with Plan of Care  Patient       Patient will benefit from skilled therapeutic intervention in order to improve the following deficits and impairments:  Improper body mechanics, Pain, Increased muscle spasms, Postural dysfunction, Hypomobility, Hypermobility, Difficulty walking, Decreased range of motion, Decreased endurance, Decreased coordination, Decreased mobility, Decreased activity tolerance, Decreased safety awareness  Visit Diagnosis: Sacrococcygeal disorders, not elsewhere classified  Other muscle spasm  Other lack of coordination     Problem List Patient Active Problem List   Diagnosis Date Noted  . Care and examination of lactating mother 04/13/2019  . Type 3a perineal laceration during delivery 02/24/2019  . Prediabetes 01/30/2018  . Increased risk of breast cancer 12/24/2016  . Family history of breast cancer 12/24/2016  . Breast mass, right 02/09/2016    Jerl Mina ,PT, DPT, E-RYT  06/17/2019, 6:13 PM  [place] MAIN [facility] SERVICES [address] [place], [place], [address] Phone: [phone]   Fax:  [phone]  Name: Shelia Daniels MRN:  [id number] Date of Birth: October 13, 1985

## 2019-06-17 NOTE — Patient Instructions (Addendum)
Deep core level 1 and 2 ( handout)   __  Standing with less locked knees

## 2019-06-22 ENCOUNTER — Other Ambulatory Visit: Payer: Self-pay

## 2019-06-22 ENCOUNTER — Ambulatory Visit: Payer: 59 | Admitting: Physical Therapy

## 2019-06-22 DIAGNOSIS — M533 Sacrococcygeal disorders, not elsewhere classified: Secondary | ICD-10-CM

## 2019-06-22 DIAGNOSIS — M62838 Other muscle spasm: Secondary | ICD-10-CM

## 2019-06-22 DIAGNOSIS — R278 Other lack of coordination: Secondary | ICD-10-CM

## 2019-06-22 NOTE — Therapy (Signed)
[place] MAIN [facility] SERVICES [address] [place], [place], [address] Phone: [phone]   Fax:  [phone]  Physical Therapy Treatment  Patient Details  Name: Shelia Daniels MRN: [id number] Date of Birth: Jan 02, 1986 Referring Provider (PT): Dani Gobble    Encounter Date: 06/22/2019  PT End of Session - 06/22/19 1647    Visit Number  5    Number of Visits  10    PT Start Time  1962    PT Stop Time  2297    PT Time Calculation (min)  62 min       Past Medical History:  Diagnosis Date  . Family history of breast cancer   . Genetic testing 12/2015   My Risk/BRCA neg  . Increased risk of breast cancer 12/2015   IBIS=21.7%    Past Surgical History:  Procedure Laterality Date  . INTRAUTERINE DEVICE (IUD) INSERTION  05/2014   Mirena    There were no vitals filed for this visit.  Subjective Assessment - 06/22/19 1607    Subjective  Pt tried her exercises 2x since last session.  Pt had L hip gets stuck in hip abd/ER motion once in a blue moon before pregnancy ( a couple times a year) now it is happening once a day. Then when she moves it past a certain point, it pops and then it moves.    Patient Stated Goals  to not have discomfort when trying to relax         [facility] PT Assessment - 06/22/19 1656      Assessment   Medical Diagnosis          PROM   Overall PROM Comments  no report of R groin pinching ,   FADDIR  B ROM restored                 Pelvic Floor Special Questions - 06/22/19 1657    Pelvic Floor Internal Exam  pt consented verbally without contraindications     Exam Type  Vaginal    Palpation  increased scar restrictions 7- 3 oclock 1-3 rd layers ( decreased post Tx)  . Noted bladder in a more lowered position but within introitus, behind pubic symphysis. Pt demo'd increased strength and propioception 3/5 with pillow under her hips to eliminate gravity.        [place] Adult PT Treatment/Exercise -  06/22/19 1658      Neuro Re-ed    Neuro Re-ed Details   cued for pelvic floor upward mobility       Moist Heat Therapy   Number Minutes Moist Heat  5 Minutes    Moist Heat Location  --   perineum ( pillow cases)      Manual Therapy   Internal Pelvic Floor  STM/MWM at areas noted inassessment                   PT Long Term Goals - 06/17/19 1609      PT LONG TERM GOAL #1   Title  Pt will report no burning/ pressure feeling when in relaxing positions and sitting for longer 30 min in softer chairs in order to improve of QOL and breastfeed baby    Time  8    Period  Weeks    Status  Achieved      PT LONG TERM GOAL #2   Title  Pt will demo proper body mechanics to minimize straining of abdomen and pelvic floor with  fitness routine (modifications to sit-up/ crunches) ( lifting dumbbells, kettlebells, stand<> floor and sit to stand t/f and lifting 40 lb back bag from ground to shoulders)    Time  10    Period  Weeks    Status  On-going      PT LONG TERM GOAL #3   Title  Pt will demo decreased perineal scar mobility in order to optimize deep core coordination and minimize pain    Time  4    Period  Weeks    Status  On-going      PT LONG TERM GOAL #4   Title  Pt will increase her PSFS score for relaxing on soft chair 5 pt to > 8pts, and rising with no pain / discomfort 7 pts to 10 pts in order to perform ADLs    Time  10    Period  Weeks    Status  On-going            Plan - 06/22/19 1813    Clinical Impression Statement  Pt is demonstrating increased SIJ mobility and no more pinching complaints at R hip during full hip flexion endrange which shows good carry over from last session. Internal pelvic floor assessment today showed increased perineal scar through all 3 layers which internal manual techniques helped to decrease.  Pt also demo'd increased pelvic floor strength and propioception with pillow under hips to eliminate gravity and elicited a more caudal position  of bladder behind pubic symphysis. Pt continues to benefit from skilled PT.  Plan to address her report of R SIJ pinching in bridging, sit to stand movements. Pt was explained the anatomy and physiology and the affects of Tx and HEP on restoring SIJ / pelvic floor mobility and deep core strength for pelvic girdle stability .    Examination-Activity Limitations  Sit    Stability/Clinical Decision Making  Stable/Uncomplicated    Rehab Potential  Good    PT Frequency  1x / week    PT Duration  --   10   PT Treatment/Interventions  Neuromuscular re-education;Moist Heat;Patient/family education;Therapeutic exercise;Therapeutic activities;Gait training;Stair training;Taping;Manual techniques;Balance training;Scar mobilization;Energy conservation    Consulted and Agree with Plan of Care  Patient       Patient will benefit from skilled therapeutic intervention in order to improve the following deficits and impairments:  Improper body mechanics, Pain, Increased muscle spasms, Postural dysfunction, Hypomobility, Hypermobility, Difficulty walking, Decreased range of motion, Decreased endurance, Decreased coordination, Decreased mobility, Decreased activity tolerance, Decreased safety awareness  Visit Diagnosis: Other muscle spasm  Sacrococcygeal disorders, not elsewhere classified  Other lack of coordination     Problem List Patient Active Problem List   Diagnosis Date Noted  . Care and examination of lactating mother 04/13/2019  . Type 3a perineal laceration during delivery 02/24/2019  . Prediabetes 01/30/2018  . Increased risk of breast cancer 12/24/2016  . Family history of breast cancer 12/24/2016  . Breast mass, right 02/09/2016    Jerl Mina ,PT, DPT, E-RYT  06/22/2019, 6:19 PM  [place] MAIN [facility] SERVICES [address] [place], [place], [address] Phone: [phone]   Fax:  [phone]  Name: Shelia Daniels MRN: [id number] Date of  Birth: August 10, 1985

## 2019-06-22 NOTE — Patient Instructions (Addendum)
  Deep core coordination to lengthen pelvic floor and strengthen deep core muscles  Keep pillow under hips/ back  Deep core level 1 and 2  This week daily     ____  Stretches :  CAN CAN   - Figure-4 and then toe touch to the ground behind you along a diagonal      "v heels slide away and then back toward buttocks and then rock knee to slight ,  slide heel along at 11 o clock away from buttocks   10 reps      childs pose stretch on forearms, toes tucked 10 reps      Happy baby      ___    During the day    foot on seat of chair, 1) wide angle hip flexor, ( back knee soft bend)  Rock forward and back 10 reps    2) line up L  Knee with L hip  R arm up  Rock forward and back 10 reps    2b) Last rep, leaning forward L hand on  R thigh, R hand on shoulder, backward circle rolls 10 reps

## 2019-06-23 ENCOUNTER — Ambulatory Visit: Payer: 59 | Admitting: Physical Therapy

## 2019-06-23 NOTE — Progress Notes (Signed)
  GYNECOLOGY OFFICE ENCOUNTER NOTE  History:  33 y.o. G1P1001 here today for today for IUD string check; Mirena  IUD was placed  05/06/2019. No complaints about the IUD, no concerning side effects. Questions regarding postpartum hair loss.   Denies difficulty breathing or respiratory distress, chest pain, abdominal pain, excessive vaginal bleeding, dysuria, and leg pain or swelling.   The following portions of the patient's history were reviewed and updated as appropriate: allergies, current medications, past family history, past medical history, past social history, past surgical history and problem list. Last pap smear on 2017-2018 was normal, negative HRHPV.  Review of Systems:   Pertinent items are noted in HPI.  Objective:   Blood pressure 99/60, pulse 75, height 5\' 6"  (1.676 m), weight 238 lb 12.8 oz (108.3 kg), currently breastfeeding.   Physical Exam  CONSTITUTIONAL: Well-developed, well-nourished female in no acute distress.  HENT:  Normocephalic, atraumatic. External right and left ear normal. Oropharynx is clear and moist  PELVIC: Normal appearing external genitalia; normal appearing vaginal mucosa and cervix.  IUD strings visualized, about three (3) cm in length outside cervix.   Assessment & Plan:   Patient to keep IUD in place for up to five (5) years; can come in for removal if she desires pregnancy earlier or for any concerning side effects.  Encouraged routine health maintenance techniques.   Education regarding Telogen Effluvium, handout provided.   Reviewed flag symptoms and when to call.   RTC x 3 months for ANNUAL EXAM or sooner if needed.    Diona Fanti, CNM [facility], [facility] 06/24/19 3:54 PM

## 2019-06-24 ENCOUNTER — Ambulatory Visit (INDEPENDENT_AMBULATORY_CARE_PROVIDER_SITE_OTHER): Payer: 59 | Admitting: Certified Nurse Midwife

## 2019-06-24 ENCOUNTER — Other Ambulatory Visit: Payer: Self-pay

## 2019-06-24 ENCOUNTER — Encounter: Payer: Self-pay | Admitting: Certified Nurse Midwife

## 2019-06-24 VITALS — BP 99/60 | HR 75 | Ht 66.0 in | Wt 238.8 lb

## 2019-06-24 DIAGNOSIS — Z975 Presence of (intrauterine) contraceptive device: Secondary | ICD-10-CM | POA: Diagnosis not present

## 2019-06-24 DIAGNOSIS — Z30431 Encounter for routine checking of intrauterine contraceptive device: Secondary | ICD-10-CM

## 2019-06-24 HISTORY — DX: Presence of (intrauterine) contraceptive device: Z97.5

## 2019-06-24 NOTE — Patient Instructions (Signed)
Preventive Care 21-35 Years Old, Female Preventive care refers to visits with your health care provider and lifestyle choices that can promote health and wellness. This includes:  A yearly physical exam. This may also be called an annual well check.  Regular dental visits and eye exams.  Immunizations.  Screening for certain conditions.  Healthy lifestyle choices, such as eating a healthy diet, getting regular exercise, not using drugs or products that contain nicotine and tobacco, and limiting alcohol use. What can I expect for my preventive care visit? Physical exam Your health care provider will check your:  Height and weight. This may be used to calculate body mass index (BMI), which tells if you are at a healthy weight.  Heart rate and blood pressure.  Skin for abnormal spots. Counseling Your health care provider may ask you questions about your:  Alcohol, tobacco, and drug use.  Emotional well-being.  Home and relationship well-being.  Sexual activity.  Eating habits.  Work and work environment.  Method of birth control.  Menstrual cycle.  Pregnancy history. What immunizations do I need?  Influenza (flu) vaccine  This is recommended every year. Tetanus, diphtheria, and pertussis (Tdap) vaccine  You may need a Td booster every 10 years. Varicella (chickenpox) vaccine  You may need this if you have not been vaccinated. Human papillomavirus (HPV) vaccine  If recommended by your health care provider, you may need three doses over 6 months. Measles, mumps, and rubella (MMR) vaccine  You may need at least one dose of MMR. You may also need a second dose. Meningococcal conjugate (MenACWY) vaccine  One dose is recommended if you are age 19-21 years and a first-year college student living in a residence hall, or if you have one of several medical conditions. You may also need additional booster doses. Pneumococcal conjugate (PCV13) vaccine  You may need  this if you have certain conditions and were not previously vaccinated. Pneumococcal polysaccharide (PPSV23) vaccine  You may need one or two doses if you smoke cigarettes or if you have certain conditions. Hepatitis A vaccine  You may need this if you have certain conditions or if you travel or work in places where you may be exposed to hepatitis A. Hepatitis B vaccine  You may need this if you have certain conditions or if you travel or work in places where you may be exposed to hepatitis B. Haemophilus influenzae type b (Hib) vaccine  You may need this if you have certain conditions. You may receive vaccines as individual doses or as more than one vaccine together in one shot (combination vaccines). Talk with your health care provider about the risks and benefits of combination vaccines. What tests do I need?  Blood tests  Lipid and cholesterol levels. These may be checked every 5 years starting at age 20.  Hepatitis C test.  Hepatitis B test. Screening  Diabetes screening. This is done by checking your blood sugar (glucose) after you have not eaten for a while (fasting).  Sexually transmitted disease (STD) testing.  BRCA-related cancer screening. This may be done if you have a family history of breast, ovarian, tubal, or peritoneal cancers.  Pelvic exam and Pap test. This may be done every 3 years starting at age 21. Starting at age 30, this may be done every 5 years if you have a Pap test in combination with an HPV test. Talk with your health care provider about your test results, treatment options, and if necessary, the need for more tests.   Follow these instructions at home: Eating and drinking   Eat a diet that includes fresh fruits and vegetables, whole grains, lean protein, and low-fat dairy.  Take vitamin and mineral supplements as recommended by your health care provider.  Do not drink alcohol if: ? Your health care provider tells you not to drink. ? You are  pregnant, may be pregnant, or are planning to become pregnant.  If you drink alcohol: ? Limit how much you have to 0-1 drink a day. ? Be aware of how much alcohol is in your drink. In the [place] one drink equals one 12 oz bottle of beer (355 mL), one 5 oz glass of wine (148 mL), or one 1 oz glass of hard liquor (44 mL). Lifestyle  Take daily care of your teeth and gums.  Stay active. Exercise for at least 30 minutes on 5 or more days each week.  Do not use any products that contain nicotine or tobacco, such as cigarettes, e-cigarettes, and chewing tobacco. If you need help quitting, ask your health care provider.  If you are sexually active, practice safe sex. Use a condom or other form of birth control (contraception) in order to prevent pregnancy and STIs (sexually transmitted infections). If you plan to become pregnant, see your health care provider for a preconception visit. What's next?  Visit your health care provider once a year for a well check visit.  Ask your health care provider how often you should have your eyes and teeth checked.  Stay up to date on all vaccines. This information is not intended to replace advice given to you by your health care provider. Make sure you discuss any questions you have with your health care provider. Document Released: 09/17/2001 Document Revised: 04/02/2018 Document Reviewed: 04/02/2018 Elsevier Patient Education  [place]. Levonorgestrel intrauterine device (IUD) What is this medicine? LEVONORGESTREL IUD (LEE voe nor jes trel) is a contraceptive (birth control) device. The device is placed inside the uterus by a healthcare professional. It is used to prevent pregnancy. This device can also be used to treat heavy bleeding that occurs during your period. This medicine may be used for other purposes; ask your health care provider or pharmacist if you have questions. COMMON BRAND NAME(S): Minette Headland What should I  tell my health care provider before I take this medicine? They need to know if you have any of these conditions:  abnormal Pap smear  cancer of the breast, uterus, or cervix  diabetes  endometritis  genital or pelvic infection now or in the past  have more than one sexual partner or your partner has more than one partner  heart disease  history of an ectopic or tubal pregnancy  immune system problems  IUD in place  liver disease or tumor  problems with blood clots or take blood-thinners  seizures  use intravenous drugs  uterus of unusual shape  vaginal bleeding that has not been explained  an unusual or allergic reaction to levonorgestrel, other hormones, silicone, or polyethylene, medicines, foods, dyes, or preservatives  pregnant or trying to get pregnant  breast-feeding How should I use this medicine? This device is placed inside the uterus by a health care professional. Talk to your pediatrician regarding the use of this medicine in children. Special care may be needed. Overdosage: If you think you have taken too much of this medicine contact a poison control center or emergency room at once. NOTE: This medicine is only for you. Do not  share this medicine with others. What if I miss a dose? This does not apply. Depending on the brand of device you have inserted, the device will need to be replaced every 3 to 6 years if you wish to continue using this type of birth control. What may interact with this medicine? Do not take this medicine with any of the following medications:  amprenavir  bosentan  fosamprenavir This medicine may also interact with the following medications:  aprepitant  armodafinil  barbiturate medicines for inducing sleep or treating seizures  bexarotene  boceprevir  griseofulvin  medicines to treat seizures like carbamazepine, ethotoin, felbamate, oxcarbazepine, phenytoin,  topiramate  modafinil  pioglitazone  rifabutin  rifampin  rifapentine  some medicines to treat HIV infection like atazanavir, efavirenz, indinavir, lopinavir, nelfinavir, tipranavir, ritonavir  St. John's wort  warfarin This list may not describe all possible interactions. Give your health care provider a list of all the medicines, herbs, non-prescription drugs, or dietary supplements you use. Also tell them if you smoke, drink alcohol, or use illegal drugs. Some items may interact with your medicine. What should I watch for while using this medicine? Visit your doctor or health care professional for regular check ups. See your doctor if you or your partner has sexual contact with others, becomes HIV positive, or gets a sexual transmitted disease. This product does not protect you against HIV infection (AIDS) or other sexually transmitted diseases. You can check the placement of the IUD yourself by reaching up to the top of your vagina with clean fingers to feel the threads. Do not pull on the threads. It is a good habit to check placement after each menstrual period. Call your doctor right away if you feel more of the IUD than just the threads or if you cannot feel the threads at all. The IUD may come out by itself. You may become pregnant if the device comes out. If you notice that the IUD has come out use a backup birth control method like condoms and call your health care provider. Using tampons will not change the position of the IUD and are okay to use during your period. This IUD can be safely scanned with magnetic resonance imaging (MRI) only under specific conditions. Before you have an MRI, tell your healthcare provider that you have an IUD in place, and which type of IUD you have in place. What side effects may I notice from receiving this medicine? Side effects that you should report to your doctor or health care professional as soon as possible:  allergic reactions like skin  rash, itching or hives, swelling of the face, lips, or tongue  fever, flu-like symptoms  genital sores  high blood pressure  no menstrual period for 6 weeks during use  pain, swelling, warmth in the leg  pelvic pain or tenderness  severe or sudden headache  signs of pregnancy  stomach cramping  sudden shortness of breath  trouble with balance, talking, or walking  unusual vaginal bleeding, discharge  yellowing of the eyes or skin Side effects that usually do not require medical attention (report to your doctor or health care professional if they continue or are bothersome):  acne  breast pain  change in sex drive or performance  changes in weight  cramping, dizziness, or faintness while the device is being inserted  headache  irregular menstrual bleeding within first 3 to 6 months of use  nausea This list may not describe all possible side effects. Call your doctor   for medical advice about side effects. You may report side effects to FDA at 1-800-FDA-1088. Where should I keep my medicine? This does not apply. NOTE: This sheet is a summary. It may not cover all possible information. If you have questions about this medicine, talk to your doctor, pharmacist, or health care provider.  2020 Elsevier/Gold Standard (2018-06-02 13:22:01)  

## 2019-06-24 NOTE — Progress Notes (Signed)
Patient here for follow up after Mirena IUD insertion, c/o hair loss.

## 2019-07-07 ENCOUNTER — Ambulatory Visit: Payer: Managed Care, Other (non HMO) | Admitting: Physical Therapy

## 2019-07-08 ENCOUNTER — Ambulatory Visit: Payer: Managed Care, Other (non HMO) | Attending: Certified Nurse Midwife | Admitting: Physical Therapy

## 2019-07-08 ENCOUNTER — Other Ambulatory Visit: Payer: Self-pay

## 2019-07-08 DIAGNOSIS — M62838 Other muscle spasm: Secondary | ICD-10-CM | POA: Diagnosis present

## 2019-07-08 DIAGNOSIS — R278 Other lack of coordination: Secondary | ICD-10-CM | POA: Diagnosis present

## 2019-07-08 DIAGNOSIS — M533 Sacrococcygeal disorders, not elsewhere classified: Secondary | ICD-10-CM | POA: Diagnosis not present

## 2019-07-08 NOTE — Therapy (Signed)
[place] MAIN [facility] SERVICES [address] [place], [place], [address] Phone: [phone]   Fax:  [phone]  Physical Therapy Treatment  Patient Details  Name: Shelia Daniels MRN: [id number] Date of Birth: 22-Oct-1985 Referring Provider (PT): Dani Gobble    Encounter Date: 07/08/2019  PT End of Session - 07/08/19 1621    Visit Number  6    Number of Visits  10    PT Start Time  8366    PT Stop Time  2947    PT Time Calculation (min)  57 min       Past Medical History:  Diagnosis Date  . Family history of breast cancer   . Genetic testing 12/2015   My Risk/BRCA neg  . Increased risk of breast cancer 12/2015   IBIS=21.7%    Past Surgical History:  Procedure Laterality Date  . INTRAUTERINE DEVICE (IUD) INSERTION  05/2014   Mirena    There were no vitals filed for this visit.  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 Pt is more aware of her body and walking. Pt notices burning is occuring less and less.    Patient Stated Goals  to not have discomfort when trying to relax         [facility] PT Assessment - 07/08/19 1835      Coordination   Gross Motor Movements are Fluid and Coordinated  --   pelvic tilt with no cues requried               Pelvic Floor Special Questions - 07/08/19 1625    Pelvic Floor Internal Exam  pt consented verbally without contraindications     Exam Type  Vaginal    Palpation  increased scar restrictions along scar at 6 oclock 1-3rd layers. iliococcygeus L > R     pre Tx: limited fascial mobility, Post Tx: increased     External: noted R coccygeus tightness, hamstring, ischial rami region  ( post Tx: decreased)    [facility] Adult PT Treatment/Exercise - 07/08/19 1836      Neuro Re-ed    Neuro Re-ed Details   cued for coordinated breathing with manual Tx       Moist Heat Therapy   Number Minutes Moist  Heat  10 Minutes    Moist Heat Location  --   perineum, thru clothing during guided relaxation     Manual Therapy   Internal Pelvic Floor  STM/MWM at probelm area noted in assessment        External technique: lateral glide and MWM to release coccygeus/ R ischial rami, R hamstring            PT Long Term Goals - 06/17/19 1609      PT LONG TERM GOAL #1   Title  Pt will report no burning/ pressure feeling when in relaxing positions and sitting for longer 30 min in softer chairs in order to improve of QOL and breastfeed baby    Time  8    Period  Weeks    Status  Achieved      PT LONG TERM GOAL #2   Title  Pt will demo proper body mechanics to minimize straining of abdomen and pelvic floor with fitness routine (modifications to sit-up/ crunches) ( lifting dumbbells, kettlebells, stand<> floor and sit to stand t/f and lifting 40 lb back bag from  ground to shoulders)    Time  10    Period  Weeks    Status  On-going      PT LONG TERM GOAL #3   Title  Pt will demo decreased perineal scar mobility in order to optimize deep core coordination and minimize pain    Time  4    Period  Weeks    Status  On-going      PT LONG TERM GOAL #4   Title  Pt will increase her PSFS score for relaxing on soft chair 5 pt to > 8pts, and rising with no pain / discomfort 7 pts to 10 pts in order to perform ADLs    Time  10    Period  Weeks    Status  On-going            Plan - 07/08/19 1832    Clinical Impression Statement  Pt demo'd improved gait mechanics today which is good carry over from last session. Continued to decrease perineal scar restrictions/ R coccygeus/ R hamstring mm tightness with internal techniques and external techniques. Pt had no complaints with Tx. Pt demo'd improved pelvic floor mobility post Tx. Provided neuromuscular reeducation for relaxation, neuroscience of pain and importance of pelvic floor mobility related to sexual intercourse. Plan to address fitness  activities pt can perform with baby. Pt continues to benefit from skilled PT.    Examination-Activity Limitations  Sit    Stability/Clinical Decision Making  Stable/Uncomplicated    Rehab Potential  Good    PT Frequency  1x / week    PT Duration  --   10   PT Treatment/Interventions  Neuromuscular re-education;Moist Heat;Patient/family education;Therapeutic exercise;Therapeutic activities;Gait training;Stair training;Taping;Manual techniques;Balance training;Scar mobilization;Energy conservation    Consulted and Agree with Plan of Care  Patient       Patient will benefit from skilled therapeutic intervention in order to improve the following deficits and impairments:  Improper body mechanics, Pain, Increased muscle spasms, Postural dysfunction, Hypomobility, Hypermobility, Difficulty walking, Decreased range of motion, Decreased endurance, Decreased coordination, Decreased mobility, Decreased activity tolerance, Decreased safety awareness  Visit Diagnosis: Sacrococcygeal disorders, not elsewhere classified  Other muscle spasm     Problem List Patient Active Problem List   Diagnosis Date Noted  . IUD (intrauterine device) in place 06/24/2019  . Type 3a perineal laceration during delivery 02/24/2019  . Prediabetes 01/30/2018  . Increased risk of breast cancer 12/24/2016  . Family history of breast cancer 12/24/2016  . Breast mass, right 02/09/2016    Jerl Mina 07/08/2019, 6:39 PM  [place] MAIN [facility] SERVICES [address] [place], [place], [address] Phone: [phone]   Fax:  [phone]  Name: Dharma Pare MRN: [id number] Date of Birth: 1986-06-12

## 2019-07-13 ENCOUNTER — Ambulatory Visit: Payer: Managed Care, Other (non HMO) | Admitting: Physical Therapy

## 2019-07-13 ENCOUNTER — Other Ambulatory Visit: Payer: Self-pay

## 2019-07-13 DIAGNOSIS — M533 Sacrococcygeal disorders, not elsewhere classified: Secondary | ICD-10-CM

## 2019-07-13 DIAGNOSIS — R278 Other lack of coordination: Secondary | ICD-10-CM

## 2019-07-13 DIAGNOSIS — M62838 Other muscle spasm: Secondary | ICD-10-CM

## 2019-07-13 NOTE — Therapy (Signed)
[place] MAIN [facility] SERVICES [address] [place], [place], [address] Phone: [phone]   Fax:  [phone]  Physical Therapy Treatment  Patient Details  Name: Shelia Daniels MRN: [id number] Date of Birth: 05/20/1986 Referring Provider (PT): Dani Gobble    Encounter Date: 07/13/2019  PT End of Session - 07/13/19 1745    Visit Number  7    Number of Visits  10    PT Start Time  3794    PT Stop Time  3276    PT Time Calculation (min)  54 min       Past Medical History:  Diagnosis Date  . Family history of breast cancer   . Genetic testing 12/2015   My Risk/BRCA neg  . Increased risk of breast cancer 12/2015   IBIS=21.7%    Past Surgical History:  Procedure Laterality Date  . INTRAUTERINE DEVICE (IUD) INSERTION  05/2014   Mirena    There were no vitals filed for this visit.  Subjective Assessment - 07/13/19 1518    Subjective  Pt noticed after last session, pt experienced no pain with wiping afte ra bowel movement. But the pain returned gradually over the past 3 days. Pt reported L hip felt like it suddenly gave in with standing.    Patient Stated Goals  to not have discomfort when trying to relax                    Pelvic Floor Special Questions - 07/13/19 1742    External Perineal Exam  external palpation with undergarment     External Palpation  coccygeus L > R, sacrotuberous ligament tightness  L > R         [facility] Adult PT Treatment/Exercise - 07/13/19 1743      Neuro Re-ed    Neuro Re-ed Details   standing posture alignment to minimize hyperextended knees which addresses less popping/ giving way of L hip       Exercises   Exercises  --   see pt instructions for lengthening coccygeus /perineal scar     Manual Therapy   Manual therapy comments  external in prone: STM/ superior glide to facilitate decreased tightness at sacrotuberous ligament, and coccygeus B                    PT  Long Term Goals - 06/17/19 1609      PT LONG TERM GOAL #1   Title  Pt will report no burning/ pressure feeling when in relaxing positions and sitting for longer 30 min in softer chairs in order to improve of QOL and breastfeed baby    Time  8    Period  Weeks    Status  Achieved      PT LONG TERM GOAL #2   Title  Pt will demo proper body mechanics to minimize straining of abdomen and pelvic floor with fitness routine (modifications to sit-up/ crunches) ( lifting dumbbells, kettlebells, stand<> floor and sit to stand t/f and lifting 40 lb back bag from ground to shoulders)    Time  10    Period  Weeks    Status  On-going      PT LONG TERM GOAL #3   Title  Pt will demo decreased perineal scar mobility in order to optimize deep core coordination and minimize pain    Time  4    Period  Weeks    Status  On-going  PT LONG TERM GOAL #4   Title  Pt will increase her PSFS score for relaxing on soft chair 5 pt to > 8pts, and rising with no pain / discomfort 7 pts to 10 pts in order to perform ADLs    Time  10    Period  Weeks    Status  On-going            Plan - 07/13/19 1746    Clinical Impression Statement  Pt is progressing well with perineal scar releases from past 2 sessions as pt reported she experienced no pain with wiping after bowel movements for one time after last session. Continued to minimize scar restrictions and coccygeus mm/ sacrotuberous ligament tightness with external manual Tx. Provided neuromuscular re-education to minimzie hypextension of knees in standing to minimzie c/o L hip popping/ giving way.  Anticipate pt will continued to make positive changes with skilled PT to achieve her goals.    Examination-Activity Limitations  Sit    Stability/Clinical Decision Making  Stable/Uncomplicated    Rehab Potential  Good    PT Frequency  1x / week    PT Duration  --   10   PT Treatment/Interventions  Neuromuscular re-education;Moist Heat;Patient/family  education;Therapeutic exercise;Therapeutic activities;Gait training;Stair training;Taping;Manual techniques;Balance training;Scar mobilization;Energy conservation    Consulted and Agree with Plan of Care  Patient       Patient will benefit from skilled therapeutic intervention in order to improve the following deficits and impairments:  Improper body mechanics, Pain, Increased muscle spasms, Postural dysfunction, Hypomobility, Hypermobility, Difficulty walking, Decreased range of motion, Decreased endurance, Decreased coordination, Decreased mobility, Decreased activity tolerance, Decreased safety awareness  Visit Diagnosis: Sacrococcygeal disorders, not elsewhere classified  Other muscle spasm  Other lack of coordination     Problem List Patient Active Problem List   Diagnosis Date Noted  . IUD (intrauterine device) in place 06/24/2019  . Type 3a perineal laceration during delivery 02/24/2019  . Prediabetes 01/30/2018  . Increased risk of breast cancer 12/24/2016  . Family history of breast cancer 12/24/2016  . Breast mass, right 02/09/2016    Jerl Mina ,PT, DPT, [id number]-RYT  07/13/2019, 5:49 PM  [place] MAIN [facility] SERVICES [address] [place], [place], [address] Phone: [phone]   Fax:  [phone]  Name: Shelia Daniels MRN: [id number] Date of Birth: 20-Sep-1985

## 2019-07-13 NOTE — Patient Instructions (Signed)
   On belly: edge of mattress  knee bent like riding a horse, move knee towards armpit and out  10 reps    Frog stretch: laying on belly , knees bent, soles up towards ceiling   inhale do nothing, exhale let ankles fall apart 45 deg  30 reps    childs pose ( wide knee) dont drop belly)  Rocking back and forth  10reps     _____  Standing posture  Shift navel forward slightly, shift weight across heel and ball mound, unlock knees  Neutral spine and pelvic alignment (like zipping a zipper)    Laying on your back:  find pelvic neutral by bridging up first , then lower ribs and lastly, your pelvis

## 2019-07-14 ENCOUNTER — Ambulatory Visit: Payer: Managed Care, Other (non HMO) | Admitting: Physical Therapy

## 2019-07-15 ENCOUNTER — Ambulatory Visit: Payer: Managed Care, Other (non HMO) | Admitting: Physical Therapy

## 2019-07-21 ENCOUNTER — Ambulatory Visit: Payer: Managed Care, Other (non HMO) | Admitting: Physical Therapy

## 2019-07-22 ENCOUNTER — Other Ambulatory Visit: Payer: Self-pay

## 2019-07-22 ENCOUNTER — Ambulatory Visit: Payer: Managed Care, Other (non HMO) | Admitting: Physical Therapy

## 2019-07-22 DIAGNOSIS — M62838 Other muscle spasm: Secondary | ICD-10-CM

## 2019-07-22 DIAGNOSIS — M533 Sacrococcygeal disorders, not elsewhere classified: Secondary | ICD-10-CM

## 2019-07-22 DIAGNOSIS — R278 Other lack of coordination: Secondary | ICD-10-CM

## 2019-07-22 NOTE — Therapy (Signed)
[place] MAIN [facility] SERVICES [address] [place], [place], [address] Phone: [phone]   Fax:  [phone]  Physical Therapy Treatment  Patient Details  Name: Shelia Daniels MRN: [id number] Date of Birth: 02/20/1986 Referring Provider (PT): Dani Gobble    Encounter Date: 07/22/2019  PT End of Session - 07/22/19 1808    Visit Number  8    Number of Visits  10    PT Start Time  1610    PT Stop Time  8341    PT Time Calculation (min)  65 min       Past Medical History:  Diagnosis Date  . Family history of breast cancer   . Genetic testing 12/2015   My Risk/BRCA neg  . Increased risk of breast cancer 12/2015   IBIS=21.7%    Past Surgical History:  Procedure Laterality Date  . INTRAUTERINE DEVICE (IUD) INSERTION  05/2014   Mirena    There were no vitals filed for this visit.  Subjective Assessment - 07/22/19 1609    Subjective  Pt reported feeling sore for 2 days after the last session. Pt noticed her L hip popping has signficantly decreased to once last week instead of daily.    Patient Stated Goals  to not have discomfort when trying to relax         [facility] PT Assessment - 07/22/19 1837      Observation/Other Assessments   Observations  required cues for equal weight bearing on BLE                 Pelvic Floor Special Questions - 07/22/19 1836    External Perineal Exam  external palpation with undergarment     External Palpation  coccgyeus L     Pelvic Floor Internal Exam  pt consented verbally without contraindications     Exam Type  Vaginal    Palpation  5-7 o'clock 1-3rd layers,  5-6 o'clock at 1st layer .    pre Tx: limited fascial mobility, Post Tx: increased        [facility] Adult PT Treatment/Exercise - 07/22/19 1809      Therapeutic Activites    Other Therapeutic Activities  t/f floor <> stand with more pelvic stability, carrying infant during floor to stand transfer       Exercises    Exercises  --   see pt instructions, perineal stretches      Moist Heat Therapy   Number Minutes Moist Heat  10 Minutes    Moist Heat Location  --   glut during internal Tx     Manual Therapy   Manual therapy comments  STM/MWM PA mob at coccgeus L     Internal Pelvic Floor  5-7 o'clock 1-3rd layers,  5-6 o'clock at 1st layer with STM/ MWM.     Kinesiotex  Facilitate Muscle      Kinesiotix   Facilitate Muscle   3 rays L buttock from coccyx                   PT Long Term Goals - 06/17/19 1609      PT LONG TERM GOAL #1   Title  Pt will report no burning/ pressure feeling when in relaxing positions and sitting for longer 30 min in softer chairs in order to improve of QOL and breastfeed baby    Time  8    Period  Weeks    Status  Achieved  PT LONG TERM GOAL #2   Title  Pt will demo proper body mechanics to minimize straining of abdomen and pelvic floor with fitness routine (modifications to sit-up/ crunches) ( lifting dumbbells, kettlebells, stand<> floor and sit to stand t/f and lifting 40 lb back bag from ground to shoulders)    Time  10    Period  Weeks    Status  On-going      PT LONG TERM GOAL #3   Title  Pt will demo decreased perineal scar mobility in order to optimize deep core coordination and minimize pain    Time  4    Period  Weeks    Status  On-going      PT LONG TERM GOAL #4   Title  Pt will increase her PSFS score for relaxing on soft chair 5 pt to > 8pts, and rising with no pain / discomfort 7 pts to 10 pts in order to perform ADLs    Time  10    Period  Weeks    Status  On-going            Plan - 07/22/19 1809    Clinical Impression Statement  Pt continues to require manual Tx internally and externally to minimize L coccygeus tightness and perineal scar restrictions. Pt's L hip popping subsided all last week which indicates current manual Tx are effective helping with arthokinematic gliding of femoral head more posteriorly. But further  manual Tx will continue to benefit pt as pt demo'd restrictions with sitting on the floor in butterfly position despite elevation of hips above knees with folded blankets. Anticipate as pt 's perineal scar restrictions continue to decrease , pt will regain more hip mobility. Pt continues to benefit from skilled PT   Examination-Activity Limitations  Sit    Stability/Clinical Decision Making  Stable/Uncomplicated    Rehab Potential  Good    PT Frequency  1x / week    PT Duration  --   10   PT Treatment/Interventions  Neuromuscular re-education;Moist Heat;Patient/family education;Therapeutic exercise;Therapeutic activities;Gait training;Stair training;Taping;Manual techniques;Balance training;Scar mobilization;Energy conservation    Consulted and Agree with Plan of Care  Patient       Patient will benefit from skilled therapeutic intervention in order to improve the following deficits and impairments:  Improper body mechanics, Pain, Increased muscle spasms, Postural dysfunction, Hypomobility, Hypermobility, Difficulty walking, Decreased range of motion, Decreased endurance, Decreased coordination, Decreased mobility, Decreased activity tolerance, Decreased safety awareness  Visit Diagnosis: Other muscle spasm  Sacrococcygeal disorders, not elsewhere classified  Other lack of coordination     Problem List Patient Active Problem List   Diagnosis Date Noted  . IUD (intrauterine device) in place 06/24/2019  . Type 3a perineal laceration during delivery 02/24/2019  . Prediabetes 01/30/2018  . Increased risk of breast cancer 12/24/2016  . Family history of breast cancer 12/24/2016  . Breast mass, right 02/09/2016    Jerl Mina ,PT, DPT, E-RYT  07/22/2019, 6:39 PM  [place] MAIN [facility] SERVICES [address] [place], [place], [address] Phone: [phone]   Fax:  [phone]  Name: Michol Emory MRN: [id number] Date of Birth:  06/05/1986

## 2019-07-22 NOTE — Patient Instructions (Signed)
Floor <> stand with baby     Baby is parallel to you or under arms, kneeling over toes, untucked Scoop baby under forearms as you squeeze shoulder blades back and down to bring baby to you   Tuck toes under, move knees and body towards ottoman/bench Place baby with your body as close to ottoman as possible  Transition to wide minisquat  Exhale to life baby up   __    Transition from standing to floor :  stand to floor transfer :      _ slow     _ mini squat      _ crawl down with one hand on thigh      _downward dog  - >  shoulders down and back-  walk the dog ( knee bents to lengthe hamstrings)      Floor to stand :   downward dog   crawl hands back, butt is back, knees behind toes -> squat  Hands at waist , elbows back, chest lifts     ___  Mermaid stretch  - rocking and then hugging ball for back stretch   Half V stretch ( hamstring) with rolled towel under straight knee, sit on folded blankets for anterior tilt of pelvis   Avoid full butterfly for now

## 2019-08-03 ENCOUNTER — Ambulatory Visit: Payer: Managed Care, Other (non HMO) | Admitting: Physical Therapy

## 2019-08-03 ENCOUNTER — Other Ambulatory Visit: Payer: Self-pay

## 2019-08-03 DIAGNOSIS — M62838 Other muscle spasm: Secondary | ICD-10-CM

## 2019-08-03 DIAGNOSIS — M533 Sacrococcygeal disorders, not elsewhere classified: Secondary | ICD-10-CM

## 2019-08-03 DIAGNOSIS — R278 Other lack of coordination: Secondary | ICD-10-CM

## 2019-08-04 NOTE — Therapy (Signed)
[place] MAIN [facility] SERVICES [address] [place], [place], [address] Phone: [phone]   Fax:  [phone]  Physical Therapy Treatment  Patient Details  Name: Shelia Daniels MRN: [id number] Date of Birth: 03-15-1986 Referring Provider (PT): Dani Gobble    Encounter Date: 08/03/2019  PT End of Session - 08/04/19 0851    Visit Number  9    Number of Visits  10    PT Start Time  3716    PT Stop Time  1720    PT Time Calculation (min)  73 min       Past Medical History:  Diagnosis Date  . Family history of breast cancer   . Genetic testing 12/2015   My Risk/BRCA neg  . Increased risk of breast cancer 12/2015   IBIS=21.7%    Past Surgical History:  Procedure Laterality Date  . INTRAUTERINE DEVICE (IUD) INSERTION  05/2014   Mirena    There were no vitals filed for this visit.  Subjective Assessment - 08/03/19 1612    Subjective  Pt reported she noticed her skin was irritated after last session where the tape was. It was sore for a few days. Pt did her stretches on the floor with her baby .   Her L hip has not been catching as much ( only 2x since 2 weeks ago) .    Patient Stated Goals  to not have discomfort when trying to relax         [facility] PT Assessment - 08/04/19 0853      PROM   Overall PROM Comments  FADDIR B WEFL but with report of pinching in anterior hip      Palpation   SI assessment   signficantly less tightness L coccgyeus                Pelvic Floor Special Questions - 08/04/19 0853    Pelvic Floor Internal Exam  pt consented verbally without contraindications     Exam Type  Vaginal    Palpation    5-6 o'clock at 1st layer, 3rd layer .significant tightness along scar     pre Tx: limited fascial mobility, Post Tx: increased     PErFECT scheme scale: 3/5 today with proper lengthening and upward movement circumferentially and sequentially   [facility] Adult PT Treatment/Exercise - 08/04/19 9678       Neuro Re-ed    Neuro Re-ed Details   guided relaxation, hip loosening exercises, pelvic tilts, principles to performing bridhes without overtightening gluts      Modalities   Modalities  Moist Heat      Moist Heat Therapy   Number Minutes Moist Heat  10 Minutes    Moist Heat Location  --   guided relaxation,     Manual Therapy   Internal Pelvic Floor    5-6 o'clock at 1st, 3rd  layer with STM/ MWM, modified pressure to minimize pain                   PT Long Term Goals - 08/04/19 0855      PT LONG TERM GOAL #1   Title  Pt will report no burning/ pressure feeling when in relaxing positions and sitting for longer 30 min in softer chairs in order to improve of QOL and breastfeed baby    Time  8    Period  Weeks    Status  Achieved      PT LONG TERM GOAL #  2   Title  Pt will demo proper body mechanics to minimize straining of abdomen and pelvic floor with fitness routine (modifications to sit-up/ crunches) ( lifting dumbbells, kettlebells, stand<> floor and sit to stand t/f and lifting 40 lb back bag from ground to shoulders)    Time  10    Period  Weeks    Status  Partially Met      PT LONG TERM GOAL #3   Title  Pt will demo decreased perineal scar mobility in order to optimize deep core coordination and minimize pain    Time  4    Period  Weeks    Status  Achieved      PT LONG TERM GOAL #4   Title  Pt will increase her PSFS score for relaxing on soft chair 5 pt to > 8pts, and rising with no pain / discomfort 7 pts to 10 pts in order to perform ADLs    Time  10    Period  Weeks    Status  On-going            Plan - 08/04/19 0855    Clinical Impression Statement  Pt's coccygeus mm tightness has significantly decreased, SIJ mobility increased.  L hip sensation of "catching" only occurred 2 x across the past 2 weeks which indicates improvement. Focused today on further perineal scar releases which facilitated more pelvic floor mobility and proper pelvic  floor contraction. However, contractions are still not added to HEP due to pt's overactivity of pelvic floor mm. Pt required cues for why bridges are also no added to HEP and educated pt on how to perform them if need for fitness classes while not overtightening glut mm. Pt demo'd correctly and was able to demo proper co-activation of deep core mm instead. Guided relaxation practice for pt which will benefit pt long term as pt has been caring for her baby while husband has been working out of town and pt has been adjusting back to working full time. Pt reported feeling relaxed. Educated pt on the importance of relaxation and stretches to minimize tightening of pelvic floor mm.  Pt continues to benefit from skilled PT.    Examination-Activity Limitations  Sit    Stability/Clinical Decision Making  Stable/Uncomplicated    Rehab Potential  Good    PT Frequency  1x / week    PT Duration  --   10   PT Treatment/Interventions  Neuromuscular re-education;Moist Heat;Patient/family education;Therapeutic exercise;Therapeutic activities;Gait training;Stair training;Taping;Manual techniques;Balance training;Scar mobilization;Energy conservation    Consulted and Agree with Plan of Care  Patient       Patient will benefit from skilled therapeutic intervention in order to improve the following deficits and impairments:  Improper body mechanics, Pain, Increased muscle spasms, Postural dysfunction, Hypomobility, Hypermobility, Difficulty walking, Decreased range of motion, Decreased endurance, Decreased coordination, Decreased mobility, Decreased activity tolerance, Decreased safety awareness  Visit Diagnosis: Other muscle spasm  Sacrococcygeal disorders, not elsewhere classified  Other lack of coordination     Problem List Patient Active Problem List   Diagnosis Date Noted  . IUD (intrauterine device) in place 06/24/2019  . Type 3a perineal laceration during delivery 02/24/2019  . Prediabetes 01/30/2018   . Increased risk of breast cancer 12/24/2016  . Family history of breast cancer 12/24/2016  . Breast mass, right 02/09/2016    Jerl Mina ,PT, DPT, E-RYT  08/04/2019, 8:56 AM  [place] MAIN [facility] SERVICES [place],  [place], [address] Phone: [phone]   Fax:  [phone]  Name: Keniyah Gelinas MRN: [id number] Date of Birth: 07-Jul-1986

## 2019-08-04 NOTE — Patient Instructions (Signed)
Cross thigh over thigh Figure 4 stretch   Both with straps under bottom thigh to decrease tightness at SIJ   ___  Lying on back, toes in and out to relax hip joints   ___

## 2019-08-12 ENCOUNTER — Ambulatory Visit: Payer: Managed Care, Other (non HMO) | Admitting: Physical Therapy

## 2019-08-19 ENCOUNTER — Other Ambulatory Visit: Payer: Self-pay

## 2019-08-19 ENCOUNTER — Ambulatory Visit: Payer: Managed Care, Other (non HMO) | Attending: Certified Nurse Midwife | Admitting: Physical Therapy

## 2019-08-19 DIAGNOSIS — M533 Sacrococcygeal disorders, not elsewhere classified: Secondary | ICD-10-CM | POA: Diagnosis present

## 2019-08-19 DIAGNOSIS — R278 Other lack of coordination: Secondary | ICD-10-CM | POA: Insufficient documentation

## 2019-08-19 DIAGNOSIS — M62838 Other muscle spasm: Secondary | ICD-10-CM | POA: Insufficient documentation

## 2019-08-19 NOTE — Therapy (Signed)
[place] MAIN [facility] SERVICES [address] [place], [place], [address] Phone: [phone]   Fax:  [phone]  Physical Therapy Treatment / Progress Note from Oct 2020 to 08/19/2019   Patient Details  Name: Shelia Daniels MRN: [id number] Date of Birth: [birth year] Referring Provider (PT): Dani Gobble    Encounter Date: 08/19/2019  PT End of Session - 08/19/19 1612    Visit Number  10    Number of Visits  10    PT Start Time  5361    PT Stop Time  1700    PT Time Calculation (min)  55 min       Past Medical History:  Diagnosis Date  . Family history of breast cancer   . Genetic testing 12/2015   My Risk/BRCA neg  . Increased risk of breast cancer 12/2015   IBIS=21.7%    Past Surgical History:  Procedure Laterality Date  . INTRAUTERINE DEVICE (IUD) INSERTION  05/2014   Mirena    There were no vitals filed for this visit.  Subjective Assessment - 08/19/19 1609    Subjective  Pt reported her L hip has not popped for 2 weeks except for 2 times. In the past it was daily. Pt has been getting in to a routine with her infant on the floor. Pt notices B lateral hips in the mermaid stretch feel super tight.    Patient Stated Goals  to not have discomfort when trying to relax         [facility] PT Assessment - 08/19/19 1625      Step Up   Comments  slight genu valgus/ medial collapse of ankle B                 Pelvic Floor Special Questions - 08/19/19 1659    Pelvic Floor Internal Exam  pt consented verbally without contraindications     Exam Type  Vaginal    Palpation    5-6 o'clock at 1st layer, 3rd layer .significant tightness along scar     pre Tx: limited fascial mobility, Post Tx: increased    Strength  good squeeze, good lift, able to hold agaisnt strong resistance    Strength # of reps  --   still withholding kegels        [facility] Adult PT Treatment/Exercise - 08/19/19 1700      Neuro Re-ed    Neuro Re-ed  Details   cued for pelvic propioception,  less genu valgus/medial collpase of ankle in step up and SLS deadlift       Manual Therapy   Internal Pelvic Floor    5-6 o'clock at 1st, 3rd  layer with STM/ MWM                    PT Long Term Goals - 08/19/19 1700      PT LONG TERM GOAL #1   Title  Pt will report no burning/ pressure feeling when in relaxing positions and sitting for longer 30 min in softer chairs in order to improve of QOL and breastfeed baby    Time  8    Period  Weeks    Status  Achieved      PT LONG TERM GOAL #2   Title  Pt will demo proper body mechanics to minimize straining of abdomen and pelvic floor with fitness routine (modifications to sit-up/ crunches) ( lifting dumbbells, kettlebells, stand<> floor and sit to stand t/f and lifting 40  lb back bag from ground to shoulders)    Time  10    Period  Weeks    Status  Partially Met      PT LONG TERM GOAL #3   Title  Pt will demo decreased perineal scar mobility in order to optimize deep core coordination and minimize pain    Time  4    Period  Weeks    Status  Achieved      PT LONG TERM GOAL #4   Title  Pt will increase her PSFS score for relaxing on soft chair 5 pt to > 8pts, and rising with no pain / discomfort 7 pts to 10 pts in order to perform ADLs  ( 08/19/19:  relaxing in soft chair and  rising with no pain / discomfort  10 pts,    Time  10    Period  Weeks    Status  Achieved      PT LONG TERM GOAL #5   Title  Pt will report more hip stability with stairs and demo les genu valgus and medial collpse of ankles and proper feet placement under hips ( wider BOS)  in order to step up on curbs and stairs    Time  5    Period  Weeks    Status  New    Target Date  09/23/19            Plan - 08/19/19 1820    Clinical Impression Statement Pt has achieved 3/5 goals and progressing well towards remaining goals. Pt has returned to being able to sit on soft chairs, sit to stand, and without pain. Pt  has demonstrated improved pelvic girdle alignment, SIJ mobility, lower kinetic chain mobility, decreased pelvic floor mm and scar tightness, and increased knowledge on how to minimize overactivity pelvic floor mm with fitness exercises.  Today pt required further manual Tx to minimize perineal scar restrictions. Pt also required neuromuscular re-education to minimize genu valgus and pronation of TC joint in upgrade lower kinetic chain strengthening exercise.  Pt continues to benefit from skilled PT.    Examination-Activity Limitations  Sit    Stability/Clinical Decision Making  Stable/Uncomplicated    Rehab Potential  Good    PT Frequency  1x / week    PT Duration  --   10   PT Treatment/Interventions  Neuromuscular re-education;Moist Heat;Patient/family education;Therapeutic exercise;Therapeutic activities;Gait training;Stair training;Taping;Manual techniques;Balance training;Scar mobilization;Energy conservation    Consulted and Agree with Plan of Care  Patient       Patient will benefit from skilled therapeutic intervention in order to improve the following deficits and impairments:  Improper body mechanics, Pain, Increased muscle spasms, Postural dysfunction, Hypomobility, Hypermobility, Difficulty walking, Decreased range of motion, Decreased endurance, Decreased coordination, Decreased mobility, Decreased activity tolerance, Decreased safety awareness  Visit Diagnosis: Other muscle spasm  Sacrococcygeal disorders, not elsewhere classified  Other lack of coordination     Problem List Patient Active Problem List   Diagnosis Date Noted  . IUD (intrauterine device) in place 06/24/2019  . Type 3a perineal laceration during delivery 02/24/2019  . Prediabetes 01/30/2018  . Increased risk of breast cancer 12/24/2016  . Family history of breast cancer 12/24/2016  . Breast mass, right 02/09/2016    Jerl Mina ,PT, DPT, E-RYT  08/19/2019, 6:20 PM  [place] [facility] MAIN [facility] SERVICES [address] [place], [place], [address] Phone: [phone]   Fax:  [phone]  Name: Jackye Dever MRN: [id number]  Date of Birth: [birth year]

## 2019-08-19 NOTE — Patient Instructions (Addendum)
  ________      Letter T, [place] on one leg with band band under L foot, wrap by big toe then, outer knee/ thigh, L hand pulling ( elbow by side)  Plant [place], toes spread, thigh out against band,, tracking knee in line with 2-3rd toe line Dipping forward, R foot lifts slight ( whole body like a see saw) off floor or  Press back foot against wall 10 reps  Both      _________  Step ups   Step up L up, R up, L down, R down, exhale up and down, lower heel slow and controlled, shoulder slightly forward, center of mass moves after ballmounds contacts  10 x 3 reps each   _________   Vaginal self-massage on scar  With Vitamin E or coconut oil  Edge of finger for pressure, side to side or up and down along scar

## 2019-08-24 ENCOUNTER — Encounter: Payer: Self-pay | Admitting: Certified Nurse Midwife

## 2019-08-26 ENCOUNTER — Ambulatory Visit: Payer: Managed Care, Other (non HMO) | Admitting: Physical Therapy

## 2019-08-26 ENCOUNTER — Other Ambulatory Visit: Payer: Self-pay

## 2019-08-26 DIAGNOSIS — R278 Other lack of coordination: Secondary | ICD-10-CM

## 2019-08-26 DIAGNOSIS — M62838 Other muscle spasm: Secondary | ICD-10-CM | POA: Diagnosis not present

## 2019-08-26 DIAGNOSIS — M533 Sacrococcygeal disorders, not elsewhere classified: Secondary | ICD-10-CM

## 2019-08-26 NOTE — Patient Instructions (Addendum)
Frog stretch 15 reps pillow under belly    On belly: Riding horse edge of mattress  knee bent like riding a horse, move knee towards armpit and out  10 reps   ___  Notice during the day to inhale, relax pelvic floor ( posterior) FAN , soft knees, sink into the midfoot    Or hip circle ( hula)

## 2019-08-27 NOTE — Therapy (Signed)
[place] MAIN [facility] SERVICES [address] [place], [place], [address] Phone: [phone]   Fax:  [phone]  Physical Therapy Treatment  Patient Details  Name: Shelia Daniels MRN: [id number] Date of Birth: September 29, 1985 Referring Provider (PT): Dani Gobble    Encounter Date: 08/26/2019  PT End of Session - 08/27/19 0903    Visit Number  11    Number of Visits  20    Date for PT Re-Evaluation  10/28/19    PT Start Time  3291    PT Stop Time  1700    PT Time Calculation (min)  57 min       Past Medical History:  Diagnosis Date  . Family history of breast cancer   . Genetic testing 12/2015   My Risk/BRCA neg  . Increased risk of breast cancer 12/2015   IBIS=21.7%    Past Surgical History:  Procedure Laterality Date  . INTRAUTERINE DEVICE (IUD) INSERTION  05/2014   Mirena    There were no vitals filed for this visit.  Subjective Assessment - 08/27/19 0901    Subjective  Pt reported she was sore after last session for only day. Pt noticed more rectal discomfort the past 2 weeks. There has been more stress at work lately. Pt continues to perform her stretches with her daughter affter work.         [facility] PT Assessment - 08/27/19 1042      Palpation   SI assessment   coccygeus R > L tightness                 Pelvic Floor Special Questions - 08/27/19 1042    Pelvic Floor Internal Exam  pt consented verbally without contraindications     Exam Type  Vaginal    Palpation    6-7 o'clock at 1st layer, 2nd layer.    pre Tx: limited fascial mobility, Post Tx: increased          -       [facility] Adult PT Treatment/Exercise - 08/27/19 1043      Neuro Re-ed    Neuro Re-ed Details   discussed being aware of tightening up rectal mm when stressed and looseing exercises for this. 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 sacrum, not billed     Manual Therapy   Internal Pelvic Floor  STM/MWM 6-7 o'clock at 1st layer, 2nd layer.        External:  STM at coccygeus fascial release at ischial rami region.  in prone and . PA mob Grade III at sacrum for more nutation           PT Long Term Goals - 08/19/19 1700      PT LONG TERM GOAL #1   Title  Pt will report no burning/ pressure feeling when in relaxing positions and sitting for longer 30 min in softer chairs in order to improve of QOL and breastfeed baby    Time  8    Period  Weeks    Status  Achieved      PT LONG TERM GOAL #2   Title  Pt will demo proper body mechanics to minimize straining of abdomen and pelvic floor with fitness routine (modifications to sit-up/ crunches) (  lifting dumbbells, kettlebells, stand<> floor and sit to stand t/f and lifting 40 lb back bag from ground to shoulders)    Time  10    Period  Weeks    Status  Partially Met      PT LONG TERM GOAL #3   Title  Pt will demo decreased perineal scar mobility in order to optimize deep core coordination and minimize pain    Time  4    Period  Weeks    Status  Achieved      PT LONG TERM GOAL #4   Title  Pt will increase her PSFS score for relaxing on soft chair 5 pt to > 8pts, and rising with no pain / discomfort 7 pts to 10 pts in order to perform ADLs  ( 08/19/19:  relaxing in soft chair and  rising with no pain / discomfort  10 pts,    Time  10    Period  Weeks    Status  Achieved      PT LONG TERM GOAL #5   Title  Pt will report more hip stability with stairs and demo les genu valgus and medial collpse of ankles and proper feet placement under hips ( wider BOS)  in order to step up on curbs and stairs    Time  5    Period  Weeks    Status  New    Target Date  09/23/19            Plan - 08/27/19 1041    Clinical Impression Statement  Pt's perineal scar is decreasing in restrictions but required  more manual releases at 1st/ 2nd layer at perineum/ ischial rami region. Coccygeus mm were tight and manual Tx helped to decrease tightness. New stretches to target this area was added and discussed ways to minimize tightness the rectal mm. Plan to perform rectal assessment next session. Pt continues to benefit from skilled PT.    Examination-Activity Limitations  Sit    Stability/Clinical Decision Making  Stable/Uncomplicated    Rehab Potential  Good    PT Frequency  1x / week    PT Duration  --   10   PT Treatment/Interventions  Neuromuscular re-education;Moist Heat;Patient/family education;Therapeutic exercise;Therapeutic activities;Gait training;Stair training;Taping;Manual techniques;Balance training;Scar mobilization;Energy conservation    Consulted and Agree with Plan of Care  Patient       Patient will benefit from skilled therapeutic intervention in order to improve the following deficits and impairments:  Improper body mechanics, Pain, Increased muscle spasms, Postural dysfunction, Hypomobility, Hypermobility, Difficulty walking, Decreased range of motion, Decreased endurance, Decreased coordination, Decreased mobility, Decreased activity tolerance, Decreased safety awareness  Visit Diagnosis: Other muscle spasm  Sacrococcygeal disorders, not elsewhere classified  Other lack of coordination     Problem List Patient Active Problem List   Diagnosis Date Noted  . IUD (intrauterine device) in place 06/24/2019  . Type 3a perineal laceration during delivery 02/24/2019  . Prediabetes 01/30/2018  . Increased risk of breast cancer 12/24/2016  . Family history of breast cancer 12/24/2016  . Breast mass, right 02/09/2016    Jerl Mina ,PT, DPT, E-RYT  08/27/2019, 10:44 AM  [place] MAIN [facility] SERVICES [address] [place], [place], [address] Phone: [phone]   Fax:  [phone]  Name: Shelia Daniels MRN: [id number] Date of  Birth: 07-02-1986

## 2019-09-09 ENCOUNTER — Ambulatory Visit: Payer: Managed Care, Other (non HMO) | Attending: Certified Nurse Midwife | Admitting: Physical Therapy

## 2019-09-09 ENCOUNTER — Other Ambulatory Visit: Payer: Self-pay

## 2019-09-09 DIAGNOSIS — M533 Sacrococcygeal disorders, not elsewhere classified: Secondary | ICD-10-CM | POA: Insufficient documentation

## 2019-09-09 DIAGNOSIS — M62838 Other muscle spasm: Secondary | ICD-10-CM | POA: Diagnosis present

## 2019-09-09 DIAGNOSIS — R278 Other lack of coordination: Secondary | ICD-10-CM | POA: Insufficient documentation

## 2019-09-09 NOTE — Therapy (Signed)
[place] MAIN [facility] SERVICES [address] [place], [place], [address] Phone: [phone]   Fax:  [phone]  Physical Therapy Treatment  Patient Details  Name: Shelia Daniels MRN: [id number] Date of Birth: May 09, 1986 Referring Provider (PT): Dani Gobble    Encounter Date: 09/09/2019  PT End of Session - 09/09/19 1658    Visit Number  12    Number of Visits  20    Date for PT Re-Evaluation  10/28/19    PT Start Time  1602    PT Stop Time  1652    PT Time Calculation (min)  50 min    Activity Tolerance  Patient tolerated treatment well    Behavior During Therapy  [facility] for tasks assessed/performed       Past Medical History:  Diagnosis Date  . Family history of breast cancer   . Genetic testing 12/2015   My Risk/BRCA neg  . Increased risk of breast cancer 12/2015   IBIS=21.7%    Past Surgical History:  Procedure Laterality Date  . INTRAUTERINE DEVICE (IUD) INSERTION  05/2014   Mirena    There were no vitals filed for this visit.  Subjective Assessment - 09/09/19 1609    Subjective  Pt reports this week has been tiring because her infant has not been sleeping . Pt felt sore after the exercises from last session. Pt has been incorporating step ups at the curb in a parking lot and at her stairs.  Pt feels constant tightness over the area by her R pocket. She notices relief with rolling on the floor. Pt noticed less rectal burning after last session but pt wonders if she has hemorrhoids because there is still burning over the past week. Pt is noticing more when she is tightening her rectum when sitting at work.         [facility] PT Assessment - 09/09/19 1756      Coordination   Gross Motor Movements are Fluid and Coordinated  --   pelvic tilt with adductor/ glut  overuse               Pelvic Floor Special Questions - 09/09/19 1752    Pelvic Floor Internal Exam  pt consented verbally without contraindications     Exam  Type  Vaginal    Palpation  ATLA, ischicoccygeus, obt int, 2nd layer scar with restrictions, tenderness, "pinchy pain"  ( post Tx: less pinchy pain, decreased mm tightness)           [facility] Adult PT Treatment/Exercise - 09/09/19 1756      Neuro Re-ed    Neuro Re-ed Details   cued for pelvic tilt with less glut/ adductor overuse      Moist Heat Therapy   Number Minutes Moist Heat  5 Minutes    Moist Heat Location  --   perineum. sacrum, not billed     Manual Therapy   Internal Pelvic Floor  STM/ MWM ATLA, ischicoccygeus, obt int, 2nd layer scar                   PT Long Term Goals - 08/19/19 1700      PT LONG TERM GOAL #1   Title  Pt will report no burning/ pressure feeling when in relaxing positions and sitting for longer 30 min in softer chairs in order to improve of QOL and breastfeed baby    Time  8    Period  Weeks    Status  Achieved      PT LONG TERM GOAL #2   Title  Pt will demo proper body mechanics to minimize straining of abdomen and pelvic floor with fitness routine (modifications to sit-up/ crunches) ( lifting dumbbells, kettlebells, stand<> floor and sit to stand t/f and lifting 40 lb back bag from ground to shoulders)    Time  10    Period  Weeks    Status  Partially Met      PT LONG TERM GOAL #3   Title  Pt will demo decreased perineal scar mobility in order to optimize deep core coordination and minimize pain    Time  4    Period  Weeks    Status  Achieved      PT LONG TERM GOAL #4   Title  Pt will increase her PSFS score for relaxing on soft chair 5 pt to > 8pts, and rising with no pain / discomfort 7 pts to 10 pts in order to perform ADLs  ( 08/19/19:  relaxing in soft chair and  rising with no pain / discomfort  10 pts,    Time  10    Period  Weeks    Status  Achieved      PT LONG TERM GOAL #5   Title  Pt will report more hip stability with stairs and demo les genu valgus and medial collpse of ankles and proper feet placement under hips (  wider BOS)  in order to step up on curbs and stairs    Time  5    Period  Weeks    Status  New    Target Date  09/23/19            Plan - 09/09/19 1802    Clinical Impression Statement  Pt demo'd significantly decreased fascial /scar restrictions and mm tightness in the posterior and L pelvic floor which is good carry over from past session. Focused today on the area of remaining restrictions located at R pelvic floor which decreased in tenderness and tightness post-Tx. Pt also reported the tightness she felt at anterolateral hip improved post Tx which indicates obturator internus involvement. Plan to perform rectal assessment at next session to address the burning with bowel movements that continues to persist. Suspect contributing factors may be due to  3rd degree perineal scar restrictions and pt's prior fitness routine involving excessive glut overuse. Pt continues to benefit from skilled PT.      Examination-Activity Limitations  Sit    Stability/Clinical Decision Making  Stable/Uncomplicated    Rehab Potential  Good    PT Frequency  1x / week    PT Duration  --   10   PT Treatment/Interventions  Neuromuscular re-education;Moist Heat;Patient/family education;Therapeutic exercise;Therapeutic activities;Gait training;Stair training;Taping;Manual techniques;Balance training;Scar mobilization;Energy conservation    Consulted and Agree with Plan of Care  Patient       Patient will benefit from skilled therapeutic intervention in order to improve the following deficits and impairments:  Improper body mechanics, Pain, Increased muscle spasms, Postural dysfunction, Hypomobility, Hypermobility, Difficulty walking, Decreased range of motion, Decreased endurance, Decreased coordination, Decreased mobility, Decreased activity tolerance, Decreased safety awareness  Visit Diagnosis: Other muscle spasm  Sacrococcygeal disorders, not elsewhere classified  Other lack of  coordination     Problem List Patient Active Problem List   Diagnosis Date Noted  . IUD (intrauterine device) in place 06/24/2019  . Type 3a perineal laceration during delivery 02/24/2019  . Prediabetes 01/30/2018  . Increased  risk of breast cancer 12/24/2016  . Family history of breast cancer 12/24/2016  . Breast mass, right 02/09/2016    Jerl Mina ,PT, DPT, E-RYT  09/09/2019, 6:03 PM  [place] MAIN [facility] SERVICES [address] [place], [place], [address] Phone: [phone]   Fax:  [phone]  Name: Shelia Daniels MRN: [id number] Date of Birth: [birth year]

## 2019-09-14 ENCOUNTER — Ambulatory Visit: Payer: Managed Care, Other (non HMO) | Admitting: Physical Therapy

## 2019-09-14 ENCOUNTER — Other Ambulatory Visit: Payer: Self-pay

## 2019-09-14 DIAGNOSIS — M62838 Other muscle spasm: Secondary | ICD-10-CM

## 2019-09-14 DIAGNOSIS — R278 Other lack of coordination: Secondary | ICD-10-CM

## 2019-09-14 DIAGNOSIS — M533 Sacrococcygeal disorders, not elsewhere classified: Secondary | ICD-10-CM

## 2019-09-15 NOTE — Therapy (Signed)
[place] MAIN [facility] SERVICES [address] [place], [place], [address] Phone: [phone]   Fax:  [phone]  Physical Therapy Treatment  Patient Details  Name: Shelia Daniels MRN: [id number] Date of Birth: 1985-10-19 Referring Provider (PT): Dani Gobble    Encounter Date: 09/14/2019  PT End of Session - 09/15/19 0014    Visit Number  13    Number of Visits  20    Date for PT Re-Evaluation  10/28/19    PT Start Time  1603    PT Stop Time  1700    PT Time Calculation (min)  57 min    Activity Tolerance  Patient tolerated treatment well    Behavior During Therapy  [facility] for tasks assessed/performed       Past Medical History:  Diagnosis Date  . Family history of breast cancer   . Genetic testing 12/2015   My Risk/BRCA neg  . Increased risk of breast cancer 12/2015   IBIS=21.7%    Past Surgical History:  Procedure Laterality Date  . INTRAUTERINE DEVICE (IUD) INSERTION  05/2014   Mirena    There were no vitals filed for this visit.  Subjective Assessment - 09/14/19 1608    Subjective  Pt felt sore for a one or two after last session. Pt has not had burning with bowel movemetns since last session. The R pocket area of tightness lessened after last session but it has come back at a little less degree. It occurs in mermaid position and knee to chest and lifting her leg.  It engages like "pinching feeling" .  L hip has not been popping for a month. She has not felt it pop multiple times a day like she used to.         [facility] PT Assessment - 09/14/19 1613      Observation/Other Assessments   Observations  standing quad stretch: pt can not grab ankle, whereas she can with L,       Strength   Overall Strength Comments  PF 7 reps L, 14 reps R        Palpation   SI assessment   sartoris/ ITband distal attachment tightness R                  Pelvic Floor Special Questions - 09/14/19 1659    Pelvic Floor Internal Exam   pt consented verbally without contraindications     Exam Type  Vaginal    Palpation   3 o'clock 2nd layer  restrictions ( previous area R with no tenderness nor tightness)         [facility] Adult PT Treatment/Exercise - 09/15/19 0014      Neuro Re-ed    Neuro Re-ed Details   relaxation guided,       Moist Heat Therapy   Number Minutes Moist Heat  5 Minutes    Moist Heat Location  --   R thigh, perineum     Manual Therapy   Manual therapy comments  STM/MWM sartoris/ ITband distal attachment R    Internal Pelvic Floor  STM/ MWM 3 o'clock 2nd layer                   PT Long Term Goals - 09/15/19 0017      PT LONG TERM GOAL #1   Title  Pt will report no burning/ pressure feeling when in relaxing positions and sitting for longer 30 min in softer chairs in order  to improve of QOL and breastfeed baby    Time  8    Period  Weeks    Status  Achieved      PT LONG TERM GOAL #2   Title  Pt will demo proper body mechanics to minimize straining of abdomen and pelvic floor with fitness routine (modifications to sit-up/ crunches) ( lifting dumbbells, kettlebells, stand<> floor and sit to stand t/f and lifting 40 lb back bag from ground to shoulders)    Time  10    Period  Weeks    Status  Partially Met      PT LONG TERM GOAL #3   Title  Pt will demo decreased perineal scar mobility in order to optimize deep core coordination and minimize pain    Time  4    Period  Weeks    Status  Achieved      PT LONG TERM GOAL #4   Title  Pt will increase her PSFS score for relaxing on soft chair 5 pt to > 8pts, and rising with no pain / discomfort 7 pts to 10 pts in order to perform ADLs  ( 08/19/19:  relaxing in soft chair and  rising with no pain / discomfort  10 pts,    Time  10    Period  Weeks    Status  Achieved      PT LONG TERM GOAL #5   Title  Pt will report more hip stability with stairs and demo les genu valgus and medial collpse of ankles and proper feet placement under hips (  wider BOS)  in order to step up on curbs and stairs    Time  5    Period  Weeks    Status  On-going            Plan - 09/15/19 0017    Clinical Impression Statement  Pt showed significantly decreased pelvic floor tightness on R but required internal releases on L at 2nd layer. Deeper layer tightness have decreased significantly. Applied manual Tx to R lateral/ anterior thigh to promote hip ER/ abduction of patella and femur on tibia . Anticipate these improvements will help with R pinching complaints with hip flexion in supine at 90-90 deg and increased ability to perform standing quad stretch on R. Plan to progress with more hip ext dominant fitness exercises and stretches. Assess stair nvaigation at next session. Pt continues to benefit from skilled PT.     Examination-Activity Limitations  Sit    Stability/Clinical Decision Making  Stable/Uncomplicated    Rehab Potential  Good    PT Frequency  1x / week    PT Duration  --   10   PT Treatment/Interventions  Neuromuscular re-education;Moist Heat;Patient/family education;Therapeutic exercise;Therapeutic activities;Gait training;Stair training;Taping;Manual techniques;Balance training;Scar mobilization;Energy conservation    Consulted and Agree with Plan of Care  Patient       Patient will benefit from skilled therapeutic intervention in order to improve the following deficits and impairments:  Improper body mechanics, Pain, Increased muscle spasms, Postural dysfunction, Hypomobility, Hypermobility, Difficulty walking, Decreased range of motion, Decreased endurance, Decreased coordination, Decreased mobility, Decreased activity tolerance, Decreased safety awareness  Visit Diagnosis: Other muscle spasm  Sacrococcygeal disorders, not elsewhere classified  Other lack of coordination     Problem List Patient Active Problem List   Diagnosis Date Noted  . IUD (intrauterine device) in place 06/24/2019  . Type 3a perineal laceration  during delivery 02/24/2019  . Prediabetes 01/30/2018  .  Increased risk of breast cancer 12/24/2016  . Family history of breast cancer 12/24/2016  . Breast mass, right 02/09/2016    Jerl Mina ,PT, DPT, E-RYT  09/15/2019, 12:18 AM  [place] MAIN [facility] SERVICES [address] [place], [place], [address] Phone: [phone]   Fax:  [phone]  Name: Zakaiya Lares MRN: [id number] Date of Birth: Dec 07, 1985

## 2019-09-21 ENCOUNTER — Encounter: Payer: Managed Care, Other (non HMO) | Admitting: Physical Therapy

## 2019-09-30 ENCOUNTER — Ambulatory Visit: Payer: Managed Care, Other (non HMO) | Admitting: Physical Therapy

## 2019-09-30 ENCOUNTER — Other Ambulatory Visit: Payer: Self-pay

## 2019-09-30 DIAGNOSIS — R278 Other lack of coordination: Secondary | ICD-10-CM

## 2019-09-30 DIAGNOSIS — M533 Sacrococcygeal disorders, not elsewhere classified: Secondary | ICD-10-CM

## 2019-09-30 DIAGNOSIS — M62838 Other muscle spasm: Secondary | ICD-10-CM | POA: Diagnosis not present

## 2019-10-01 ENCOUNTER — Other Ambulatory Visit: Payer: Self-pay

## 2019-10-01 ENCOUNTER — Other Ambulatory Visit (HOSPITAL_COMMUNITY)
Admission: RE | Admit: 2019-10-01 | Discharge: 2019-10-01 | Disposition: A | Discharge status: Home / Self Care | Payer: 59 | Source: Ambulatory Visit | Attending: Certified Nurse Midwife | Admitting: Certified Nurse Midwife

## 2019-10-01 ENCOUNTER — Encounter: Payer: Self-pay | Admitting: Certified Nurse Midwife

## 2019-10-01 ENCOUNTER — Ambulatory Visit (INDEPENDENT_AMBULATORY_CARE_PROVIDER_SITE_OTHER): Payer: Managed Care, Other (non HMO) | Admitting: Certified Nurse Midwife

## 2019-10-01 VITALS — BP 101/68 | HR 94 | Ht 66.0 in | Wt 241.8 lb

## 2019-10-01 DIAGNOSIS — Z01419 Encounter for gynecological examination (general) (routine) without abnormal findings: Secondary | ICD-10-CM

## 2019-10-01 DIAGNOSIS — Z8639 Personal history of other endocrine, nutritional and metabolic disease: Secondary | ICD-10-CM | POA: Diagnosis not present

## 2019-10-01 DIAGNOSIS — Z1151 Encounter for screening for human papillomavirus (HPV): Secondary | ICD-10-CM | POA: Diagnosis not present

## 2019-10-01 DIAGNOSIS — Z975 Presence of (intrauterine) contraceptive device: Secondary | ICD-10-CM | POA: Diagnosis not present

## 2019-10-01 DIAGNOSIS — Z124 Encounter for screening for malignant neoplasm of cervix: Secondary | ICD-10-CM

## 2019-10-01 DIAGNOSIS — E669 Obesity, unspecified: Secondary | ICD-10-CM

## 2019-10-01 DIAGNOSIS — Z87898 Personal history of other specified conditions: Secondary | ICD-10-CM

## 2019-10-01 NOTE — Progress Notes (Signed)
Patient here for annual exam.  Patient c/o pain after bowel movements "between my vagina and rectum" x7 months.

## 2019-10-01 NOTE — Addendum Note (Signed)
Addended by: Mariane Masters on: 10/01/2019 12:58 PM   Modules accepted: Orders

## 2019-10-01 NOTE — Patient Instructions (Signed)
Levonorgestrel intrauterine device (IUD) What is this medicine? LEVONORGESTREL IUD (LEE voe nor jes trel) is a contraceptive (birth control) device. The device is placed inside the uterus by a healthcare professional. It is used to prevent pregnancy. This device can also be used to treat heavy bleeding that occurs during your period. This medicine may be used for other purposes; ask your health care provider or pharmacist if you have questions. COMMON BRAND NAME(S): Minette Headland What should I tell my health care provider before I take this medicine? They need to know if you have any of these conditions:  abnormal Pap smear  cancer of the breast, uterus, or cervix  diabetes  endometritis  genital or pelvic infection now or in the past  have more than one sexual partner or your partner has more than one partner  heart disease  history of an ectopic or tubal pregnancy  immune system problems  IUD in place  liver disease or tumor  problems with blood clots or take blood-thinners  seizures  use intravenous drugs  uterus of unusual shape  vaginal bleeding that has not been explained  an unusual or allergic reaction to levonorgestrel, other hormones, silicone, or polyethylene, medicines, foods, dyes, or preservatives  pregnant or trying to get pregnant  breast-feeding How should I use this medicine? This device is placed inside the uterus by a health care professional. Talk to your pediatrician regarding the use of this medicine in children. Special care may be needed. Overdosage: If you think you have taken too much of this medicine contact a poison control center or emergency room at once. NOTE: This medicine is only for you. Do not share this medicine with others. What if I miss a dose? This does not apply. Depending on the brand of device you have inserted, the device will need to be replaced every 3 to 6 years if you wish to continue using this type  of birth control. What may interact with this medicine? Do not take this medicine with any of the following medications:  amprenavir  bosentan  fosamprenavir This medicine may also interact with the following medications:  aprepitant  armodafinil  barbiturate medicines for inducing sleep or treating seizures  bexarotene  boceprevir  griseofulvin  medicines to treat seizures like carbamazepine, ethotoin, felbamate, oxcarbazepine, phenytoin, topiramate  modafinil  pioglitazone  rifabutin  rifampin  rifapentine  some medicines to treat HIV infection like atazanavir, efavirenz, indinavir, lopinavir, nelfinavir, tipranavir, ritonavir  St. John's wort  warfarin This list may not describe all possible interactions. Give your health care provider a list of all the medicines, herbs, non-prescription drugs, or dietary supplements you use. Also tell them if you smoke, drink alcohol, or use illegal drugs. Some items may interact with your medicine. What should I watch for while using this medicine? Visit your doctor or health care professional for regular check ups. See your doctor if you or your partner has sexual contact with others, becomes HIV positive, or gets a sexual transmitted disease. This product does not protect you against HIV infection (AIDS) or other sexually transmitted diseases. You can check the placement of the IUD yourself by reaching up to the top of your vagina with clean fingers to feel the threads. Do not pull on the threads. It is a good habit to check placement after each menstrual period. Call your doctor right away if you feel more of the IUD than just the threads or if you cannot feel the threads at  all. The IUD may come out by itself. You may become pregnant if the device comes out. If you notice that the IUD has come out use a backup birth control method like condoms and call your health care provider. Using tampons will not change the position of the  IUD and are okay to use during your period. This IUD can be safely scanned with magnetic resonance imaging (MRI) only under specific conditions. Before you have an MRI, tell your healthcare provider that you have an IUD in place, and which type of IUD you have in place. What side effects may I notice from receiving this medicine? Side effects that you should report to your doctor or health care professional as soon as possible:  allergic reactions like skin rash, itching or hives, swelling of the face, lips, or tongue  fever, flu-like symptoms  genital sores  high blood pressure  no menstrual period for 6 weeks during use  pain, swelling, warmth in the leg  pelvic pain or tenderness  severe or sudden headache  signs of pregnancy  stomach cramping  sudden shortness of breath  trouble with balance, talking, or walking  unusual vaginal bleeding, discharge  yellowing of the eyes or skin Side effects that usually do not require medical attention (report to your doctor or health care professional if they continue or are bothersome):  acne  breast pain  change in sex drive or performance  changes in weight  cramping, dizziness, or faintness while the device is being inserted  headache  irregular menstrual bleeding within first 3 to 6 months of use  nausea This list may not describe all possible side effects. Call your doctor for medical advice about side effects. You may report side effects to FDA at 1-800-FDA-1088. Where should I keep my medicine? This does not apply. NOTE: This sheet is a summary. It may not cover all possible information. If you have questions about this medicine, talk to your doctor, pharmacist, or health care provider.  2020 Elsevier/Gold Standard (2018-06-02 13:22:01)   Preventive Care 21-39 Years Old, Female Preventive care refers to visits with your health care provider and lifestyle choices that can promote health and wellness. This  includes:  A yearly physical exam. This may also be called an annual well check.  Regular dental visits and eye exams.  Immunizations.  Screening for certain conditions.  Healthy lifestyle choices, such as eating a healthy diet, getting regular exercise, not using drugs or products that contain nicotine and tobacco, and limiting alcohol use. What can I expect for my preventive care visit? Physical exam Your health care provider will check your:  Height and weight. This may be used to calculate body mass index (BMI), which tells if you are at a healthy weight.  Heart rate and blood pressure.  Skin for abnormal spots. Counseling Your health care provider may ask you questions about your:  Alcohol, tobacco, and drug use.  Emotional well-being.  Home and relationship well-being.  Sexual activity.  Eating habits.  Work and work environment.  Method of birth control.  Menstrual cycle.  Pregnancy history. What immunizations do I need?  Influenza (flu) vaccine  This is recommended every year. Tetanus, diphtheria, and pertussis (Tdap) vaccine  You may need a Td booster every 10 years. Varicella (chickenpox) vaccine  You may need this if you have not been vaccinated. Human papillomavirus (HPV) vaccine  If recommended by your health care provider, you may need three doses over 6 months. Measles, mumps, and   rubella (MMR) vaccine  You may need at least one dose of MMR. You may also need a second dose. Meningococcal conjugate (MenACWY) vaccine  One dose is recommended if you are age 19-21 years and a first-year college student living in a residence hall, or if you have one of several medical conditions. You may also need additional booster doses. Pneumococcal conjugate (PCV13) vaccine  You may need this if you have certain conditions and were not previously vaccinated. Pneumococcal polysaccharide (PPSV23) vaccine  You may need one or two doses if you smoke  cigarettes or if you have certain conditions. Hepatitis A vaccine  You may need this if you have certain conditions or if you travel or work in places where you may be exposed to hepatitis A. Hepatitis B vaccine  You may need this if you have certain conditions or if you travel or work in places where you may be exposed to hepatitis B. Haemophilus influenzae type b (Hib) vaccine  You may need this if you have certain conditions. You may receive vaccines as individual doses or as more than one vaccine together in one shot (combination vaccines). Talk with your health care provider about the risks and benefits of combination vaccines. What tests do I need?  Blood tests  Lipid and cholesterol levels. These may be checked every 5 years starting at age 20.  Hepatitis C test.  Hepatitis B test. Screening  Diabetes screening. This is done by checking your blood sugar (glucose) after you have not eaten for a while (fasting).  Sexually transmitted disease (STD) testing.  BRCA-related cancer screening. This may be done if you have a family history of breast, ovarian, tubal, or peritoneal cancers.  Pelvic exam and Pap test. This may be done every 3 years starting at age 21. Starting at age 30, this may be done every 5 years if you have a Pap test in combination with an HPV test. Talk with your health care provider about your test results, treatment options, and if necessary, the need for more tests. Follow these instructions at home: Eating and drinking   Eat a diet that includes fresh fruits and vegetables, whole grains, lean protein, and low-fat dairy.  Take vitamin and mineral supplements as recommended by your health care provider.  Do not drink alcohol if: ? Your health care provider tells you not to drink. ? You are pregnant, may be pregnant, or are planning to become pregnant.  If you drink alcohol: ? Limit how much you have to 0-1 drink a day. ? Be aware of how much alcohol  is in your drink. In the [place] one drink equals one 12 oz bottle of beer (355 mL), one 5 oz glass of wine (148 mL), or one 1 oz glass of hard liquor (44 mL). Lifestyle  Take daily care of your teeth and gums.  Stay active. Exercise for at least 30 minutes on 5 or more days each week.  Do not use any products that contain nicotine or tobacco, such as cigarettes, e-cigarettes, and chewing tobacco. If you need help quitting, ask your health care provider.  If you are sexually active, practice safe sex. Use a condom or other form of birth control (contraception) in order to prevent pregnancy and STIs (sexually transmitted infections). If you plan to become pregnant, see your health care provider for a preconception visit. What's next?  Visit your health care provider once a year for a well check visit.  Ask your health care provider how   often you should have your eyes and teeth checked.  Stay up to date on all vaccines. This information is not intended to replace advice given to you by your health care provider. Make sure you discuss any questions you have with your health care provider. Document Revised: 04/02/2018 Document Reviewed: 04/02/2018 Elsevier Patient Education  2020 Elsevier Inc.  

## 2019-10-01 NOTE — Therapy (Addendum)
[place] [facility] SERVICES [address] [place], [place], [address] Phone: [phone]   Fax:  [phone]  Physical Therapy Treatment / Progress Note   Patient Details  Name: Shelia Daniels MRN: [id number] Date of Birth: July 05, 1986 Referring Provider (PT): Dani Gobble    Encounter Date: 09/30/2019  PT End of Session - 10/01/19 1244    Visit Number  14    Number of Visits  20    Date for PT Re-Evaluation  10/28/19    PT Start Time  1603    PT Stop Time  1700    PT Time Calculation (min)  57 min    Activity Tolerance  Patient tolerated treatment well    Behavior During Therapy  [facility] for tasks assessed/performed       Past Medical History:  Diagnosis Date  . Family history of breast cancer   . Genetic testing 12/2015   My Risk/BRCA neg  . Increased risk of breast cancer 12/2015   IBIS=21.7%    Past Surgical History:  Procedure Laterality Date  . INTRAUTERINE DEVICE (IUD) INSERTION  05/2014   Mirena    There were no vitals filed for this visit.  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         [facility] PT Assessment - 10/01/19 1246      Observation/Other Assessments   Observations  hallus valgus on R        Other:   Other/ Comments  bridging without pinching in her back       Palpation   Palpation comment  PAD/DAB interoseus R mm tightness                 Pelvic Floor Special Questions - 09/30/19 1710    Pelvic Floor Internal Exam  pt consented verbally without contraindications     Exam Type  Vaginal    Palpation  R fascial restrictions along ATLA , medial ischial tuberosity  ( pinching pain at ATLA  )         [facility] Adult PT Treatment/Exercise - 10/01/19 1245      Moist Heat Therapy   Number Minutes Moist Heat  8 Minutes    Moist Heat  Location  --   perineum, sacrum      Manual Therapy   Manual therapy comments  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     Internal Pelvic Floor  STM/MWM at R ATLA, medial ischial tuberosity                   PT Long Term Goals - 09/15/19 0017      PT LONG TERM GOAL #1   Title  Pt will report no burning/ pressure feeling when in relaxing positions and sitting for longer 30 min in softer chairs in order to improve of QOL and breastfeed baby    Time  8    Period  Weeks    Status  Achieved      PT LONG TERM GOAL #2   Title  Pt will demo proper body mechanics to minimize straining of abdomen and pelvic floor with fitness routine (modifications to sit-up/ crunches) ( lifting dumbbells, kettlebells, stand<> floor and sit to stand t/f and lifting 40 lb back bag from ground to shoulders)    Time  10    Period  Weeks    Status  Partially Met      PT LONG TERM GOAL #3   Title  Pt will demo decreased perineal scar mobility in order to optimize deep core coordination and minimize pain    Time  4    Period  Weeks    Status  Achieved      PT LONG TERM GOAL #4   Title  Pt will increase her PSFS score for relaxing on soft chair 5 pt to > 8pts, and rising with no pain / discomfort 7 pts to 10 pts in order to perform ADLs  ( 08/19/19:  relaxing in soft chair and  rising with no pain / discomfort  10 pts,    Time  10    Period  Weeks    Status  Achieved      PT LONG TERM GOAL #5   Title  Pt will report more hip stability with stairs and demo les genu valgus and medial collpse of ankles and proper feet placement under hips ( wider BOS)  in order to step up on curbs and stairs    Time  5    Period  Weeks    Status  On-going            Plan - 10/01/19 1245    Clinical Impression Statement  Pt has achieved 3/5 goals with the following functional improvements: _pt was been able to teach fitness class and not experience pain during and afterwards compared to the past when she would  experience pain for 3 days  _pt is able to sit on softer chairs   Pt 's MSK improvements include: _decreased hip mm tightness, increased SIJ mobility, decreased perineal scar restrictions, less tenderness with palpation on pelvic floor mm, improved posture, hip/knee/ feet alignment  _increased understanding on how to modify fitness exercises but pt will require more education on additional exercises  Pt has remained compliance with HEP despite busy schedules at work and with infant. Anticipate pt will achieve remaining goals.   Pt continues to benefit from skilled PT.      Examination-Activity Limitations  Sit    Stability/Clinical Decision Making  Stable/Uncomplicated    Rehab Potential  Good    PT Frequency  1x / week    PT Duration  --   10   PT Treatment/Interventions  Neuromuscular re-education;Moist Heat;Patient/family education;Therapeutic exercise;Therapeutic activities;Gait training;Stair training;Taping;Manual techniques;Balance training;Scar mobilization;Energy conservation    Consulted and Agree with Plan of Care  Patient       Patient will benefit from skilled therapeutic intervention in order to improve the following deficits and impairments:  Improper body mechanics, Pain, Increased muscle spasms, Postural dysfunction, Hypomobility, Hypermobility, Difficulty walking, Decreased range of motion, Decreased endurance, Decreased coordination, Decreased mobility, Decreased activity tolerance, Decreased safety awareness  Visit Diagnosis: Other muscle spasm  Sacrococcygeal disorders, not elsewhere classified  Other lack of coordination     Problem List Patient Active Problem List   Diagnosis Date Noted  . IUD (intrauterine device) in place 06/24/2019  . Type 3a perineal laceration during delivery 02/24/2019  . Prediabetes 01/30/2018  . Increased risk of breast cancer 12/24/2016  . Family history of breast cancer 12/24/2016  . Breast mass, right 02/09/2016     Jerl Mina ,PT, DPT, E-RYT  10/01/2019, 12:51 PM  [place] [facility] SERVICES [address] [place], [place], [address] Phone: [phone]   Fax:  [phone]  Name: Shelia Daniels MRN: [id number] Date of Birth: [birth year]

## 2019-10-02 DIAGNOSIS — Z8639 Personal history of other endocrine, nutritional and metabolic disease: Secondary | ICD-10-CM | POA: Insufficient documentation

## 2019-10-02 NOTE — Progress Notes (Addendum)
ANNUAL PREVENTATIVE CARE GYN  ENCOUNTER NOTE  Subjective:       Shelia Daniels is a 34 y.o. G9P1001 female here for a routine annual gynecologic exam.  Current complaints:  1. Needs Pap smear  2. Questions regarding scheduling mammogram while breastfeeding   3. Pain after bowel movements between vagina and rectum for the last seven (7) months; currently seeing pelvic floor physical therapist  Denies difficulty breathing or respiratory distress, chest pain, abdominal pain, excessive vaginal bleeding, dysuria, and leg pain or swelling.    Gynecologic History No LMP recorded (lmp unknown). (Menstrual status: Lactating).   Contraception: IUD, Mirena   Last Pap: due.   Last mammogram: 2019. Results were normal.   Obstetric History  OB History  Gravida Para Term Preterm AB Living  1 1 1  0 0 1  SAB TAB Ectopic Multiple Live Births  0 0 0 0 1    # Outcome Date GA Lbr Len/2nd Weight Sex Delivery Anes PTL Lv  1 Term 02/24/19 [redacted]w[redacted]d/ 00:37 8 lb 0.4 oz (3.64 kg) F Vag-Spont EPI  LIV    Past Medical History:  Diagnosis Date  . Family history of breast cancer   . Genetic testing 12/2015   My Risk/BRCA neg  . Increased risk of breast cancer 12/2015   IBIS=21.7%    Past Surgical History:  Procedure Laterality Date  . INTRAUTERINE DEVICE (IUD) INSERTION  05/2014   Mirena    Current Outpatient Medications on File Prior to Visit  Medication Sig Dispense Refill  . acetaminophen (TYLENOL) 325 MG tablet Take 650 mg by mouth every 6 (six) hours as needed.    . docusate sodium (COLACE) 100 MG capsule Take 100 mg by mouth 2 (two) times daily.    . Ferrous Sulfate (IRON) 325 (65 Fe) MG TABS Take 1 tablet by mouth daily.    . fluticasone (FLONASE) 50 MCG/ACT nasal spray Place 2 sprays into both nostrils as needed for allergies or rhinitis.    .Marland Kitchenlevonorgestrel (MIRENA) 20 MCG/24HR IUD 1 each by Intrauterine route once.    . loratadine (CLARITIN) 10 MG tablet Take 10 mg by mouth as  needed.     . Prenatal MV-Min-Fe Fum-FA-DHA (PRENATAL+DHA PO)     . Vitamin D, Ergocalciferol, (DRISDOL) 1.25 MG (50000 UT) CAPS capsule Take 1 capsule (50,000 Units total) by mouth every 7 (seven) days. 90 capsule 6   No current facility-administered medications on file prior to visit.    Allergies  Allergen Reactions  . Pollen Extract Other (See Comments)    Sneezing, watery eyes, seasonal    Social History   Socioeconomic History  . Marital status: Married    Spouse name: Not on file  . Number of children: Not on file  . Years of education: Not on file  . Highest education level: Not on file  Occupational History  . Not on file  Tobacco Use  . Smoking status: Never Smoker  . Smokeless tobacco: Never Used  Substance and Sexual Activity  . Alcohol use: Yes    Comment: occasional   . Drug use: No  . Sexual activity: Not Currently    Birth control/protection: I.U.D.  Other Topics Concern  . Not on file  Social History Narrative  . Not on file   Social Determinants of Health   Financial Resource Strain:   . Difficulty of Paying Living Expenses: Not on file  Food Insecurity:   . Worried About RCharity fundraiserin the Last  Year: Not on file  . Ran Out of Food in the Last Year: Not on file  Transportation Needs:   . Lack of Transportation (Medical): Not on file  . Lack of Transportation (Non-Medical): Not on file  Physical Activity:   . Days of Exercise per Week: Not on file  . Minutes of Exercise per Session: Not on file  Stress:   . Feeling of Stress : Not on file  Social Connections: Somewhat Isolated  . Frequency of Communication with Friends and Family: Never  . Frequency of Social Gatherings with Friends and Family: Never  . Attends Religious Services: Never  . Active Member of Clubs or Organizations: Yes  . Attends Archivist Meetings: 1 to 4 times per year  . Marital Status: Married  Human resources officer Violence: Not At Risk  . Fear of Current  or Ex-Partner: No  . Emotionally Abused: No  . Physically Abused: No  . Sexually Abused: No    Family History  Problem Relation Age of Onset  . Breast cancer Paternal Grandmother 27  . Pancreatic cancer Paternal Grandmother   . Diabetes Paternal Grandmother   . Diabetes Mother   . Hyperlipidemia Mother   . Hypertension Mother   . Diabetes Father   . Heart attack Father   . Hyperlipidemia Maternal Uncle   . Hypertension Maternal Uncle   . Hyperlipidemia Maternal Grandfather   . Hypertension Maternal Grandfather   . Lymphoma Paternal Grandfather   . Ovarian cancer Neg Hx   . Colon cancer Neg Hx     The following portions of the patient's history were reviewed and updated as appropriate: allergies, current medications, past family history, past medical history, past social history, past surgical history and problem list.  Review of Systems  ROS negative except as noted above. Information obtained from patient.    Objective:   BP 101/68   Pulse 94   Ht 5' 6"  (1.676 m)   Wt 241 lb 12.8 oz (109.7 kg)   LMP  (LMP Unknown)   Breastfeeding Yes   BMI 39.03 kg/m    CONSTITUTIONAL: Well-developed, well-nourished female in no acute distress.   PSYCHIATRIC: Normal mood and affect. Normal behavior. Normal judgment and thought content.  [place]: Alert and oriented to person, place, and time. Normal muscle tone coordination. No cranial nerve deficit noted.  HENT:  Normocephalic, atraumatic, External right and left ear normal.  EYES: Conjunctivae and EOM are normal. Pupils are equal and round.   NECK: Normal range of motion, supple, no masses.  Normal thyroid.   SKIN: Skin is warm and dry. No rash noted. Not diaphoretic. No erythema. No pallor.  CARDIOVASCULAR: Normal heart rate noted, regular rhythm, no murmur.  RESPIRATORY: Clear to auscultation bilaterally. Effort and breath sounds normal, no problems with respiration noted.  BREASTS: Symmetric in size. No masses, skin  changes, nipple drainage, or lymphadenopathy. Lactating.   ABDOMEN: Soft, normal bowel sounds, no distention noted.  No tenderness, rebound or guarding.   PELVIC:  External Genitalia: Normal  Vagina: Normal  Cervix: Normal, IUD strings present, Pap collected  Uterus: Normal  Adnexa: Normal  RV: External Exam NormaI    MUSCULOSKELETAL: Normal range of motion. No tenderness.  No cyanosis, clubbing, or edema.  2+ distal pulses.  LYMPHATIC: No Axillary, Supraclavicular, or Inguinal Adenopathy.  Assessment:   Annual gynecologic examination 34 y.o.   Contraception: IUD, Mirena   Obesity 2   Problem List Items Addressed This Visit  Other   IUD (intrauterine device) in place   History of vitamin D deficiency   Relevant Orders   VITAMIN D 25 Hydroxy (Vit-D Deficiency, Fractures)    Other Visit Diagnoses    Well woman exam    -  Primary   Relevant Orders   Thyroid Panel With TSH   CBC   Lipid panel   Hemoglobin A1c   VITAMIN D 25 Hydroxy (Vit-D Deficiency, Fractures)   Screening for cervical cancer       Relevant Orders   Cytology - PAP   History of prediabetes       Relevant Orders   Hemoglobin A1c   Obesity (BMI 30-39.9)       Relevant Orders   Thyroid Panel With TSH   Lipid panel   Hemoglobin A1c   VITAMIN D 25 Hydroxy (Vit-D Deficiency, Fractures)      Plan:   Pap: Pap Co Test   Mammogram: Discussed waiting six (6) months after cessation of breastfeeding or scheduling with experience radiologist; message sent to [facility]: See orders   Routine preventative health maintenance measures emphasized: Exercise/Diet/Weight control, Tobacco Warnings, Alcohol/Substance use risks and Stress Management; see AVS  Discussed home treatment measures for perineal discomfort  Encouraged to continue pelvic floor physical therapy  Reviewed red flag symptoms and when to call  RTC x 1 year for ANNUAL EXAM or sooner if needed   Diona Fanti,  CNM [facility], [facility] 10/02/19 12:49 PM

## 2019-10-06 LAB — CYTOLOGY - PAP
Comment: NEGATIVE
Diagnosis: NEGATIVE
High risk HPV: NEGATIVE

## 2019-10-08 ENCOUNTER — Other Ambulatory Visit: Payer: 59

## 2019-10-08 ENCOUNTER — Other Ambulatory Visit: Payer: Self-pay

## 2019-10-08 DIAGNOSIS — E669 Obesity, unspecified: Secondary | ICD-10-CM

## 2019-10-08 DIAGNOSIS — R739 Hyperglycemia, unspecified: Secondary | ICD-10-CM

## 2019-10-08 DIAGNOSIS — Z130 Encounter for screening for diseases of the blood and blood-forming organs and certain disorders involving the immune mechanism: Secondary | ICD-10-CM

## 2019-10-08 DIAGNOSIS — Z8639 Personal history of other endocrine, nutritional and metabolic disease: Secondary | ICD-10-CM

## 2019-10-08 DIAGNOSIS — Z01419 Encounter for gynecological examination (general) (routine) without abnormal findings: Secondary | ICD-10-CM

## 2019-10-09 LAB — VITAMIN D 25 HYDROXY (VIT D DEFICIENCY, FRACTURES): Vit D, 25-Hydroxy: 35.6 ng/mL (ref 30.0–100.0)

## 2019-10-09 LAB — HEMOGLOBIN A1C
Est. average glucose Bld gHb Est-mCnc: 114 mg/dL
Hgb A1c MFr Bld: 5.6 % (ref 4.8–5.6)

## 2019-10-09 LAB — CBC
Hematocrit: 38.9 % (ref 34.0–46.6)
Hemoglobin: 13.1 g/dL (ref 11.1–15.9)
MCH: 27.8 pg (ref 26.6–33.0)
MCHC: 33.7 g/dL (ref 31.5–35.7)
MCV: 83 fL (ref 79–97)
Platelets: 319 10*3/uL (ref 150–450)
RBC: 4.71 x10E6/uL (ref 3.77–5.28)
RDW: 14.1 % (ref 11.7–15.4)
WBC: 7.1 10*3/uL (ref 3.4–10.8)

## 2019-10-09 LAB — THYROID PANEL WITH TSH
Free Thyroxine Index: 2 (ref 1.2–4.9)
T3 Uptake Ratio: 26 % (ref 24–39)
T4, Total: 7.7 ug/dL (ref 4.5–12.0)
TSH: 2.72 u[IU]/mL (ref 0.450–4.500)

## 2019-10-09 LAB — LIPID PANEL
Chol/HDL Ratio: 3.6 ratio (ref 0.0–4.4)
Cholesterol, Total: 182 mg/dL (ref 100–199)
HDL: 51 mg/dL (ref >39)
LDL Chol Calc (NIH): 115 mg/dL — ABNORMAL HIGH (ref 0–99)
Triglycerides: 85 mg/dL (ref 0–149)
VLDL Cholesterol Cal: 16 mg/dL (ref 5–40)

## 2019-10-11 NOTE — Telephone Encounter (Signed)
Please schedule lab visit for repeat lipid panel in six (6) months due to elevated cholesterol. Thanks, JML

## 2019-10-21 ENCOUNTER — Ambulatory Visit: Payer: 59 | Admitting: Physical Therapy

## 2019-10-25 ENCOUNTER — Encounter: Payer: 59 | Admitting: Physical Therapy

## 2019-11-04 ENCOUNTER — Ambulatory Visit: Payer: 59 | Attending: Certified Nurse Midwife | Admitting: Physical Therapy

## 2019-11-04 ENCOUNTER — Other Ambulatory Visit: Payer: Self-pay

## 2019-11-04 DIAGNOSIS — M533 Sacrococcygeal disorders, not elsewhere classified: Secondary | ICD-10-CM | POA: Insufficient documentation

## 2019-11-04 DIAGNOSIS — M62838 Other muscle spasm: Secondary | ICD-10-CM | POA: Diagnosis not present

## 2019-11-04 DIAGNOSIS — R278 Other lack of coordination: Secondary | ICD-10-CM | POA: Insufficient documentation

## 2019-11-05 NOTE — Therapy (Signed)
[place] MAIN [facility] SERVICES [address] [place], [place], [address] Phone: [phone]   Fax:  [phone]  Physical Therapy Treatment  Patient Details  Name: Shelia Daniels MRN: [id number] Date of Birth: 20-Mar-1986 Referring Provider (PT): Dani Gobble    Encounter Date: 11/04/2019  PT End of Session - 11/05/19 0100    Visit Number  16    Number of Visits  30    Date for PT Re-Evaluation  12/10/19    PT Start Time  1603    PT Stop Time  1700    PT Time Calculation (min)  57 min    Activity Tolerance  Patient tolerated treatment well    Behavior During Therapy  [facility] for tasks assessed/performed       Past Medical History:  Diagnosis Date  . Family history of breast cancer   . Genetic testing 12/2015   My Risk/BRCA neg  . Increased risk of breast cancer 12/2015   IBIS=21.7%    Past Surgical History:  Procedure Laterality Date  . INTRAUTERINE DEVICE (IUD) INSERTION  05/2014   Mirena    There were no vitals filed for this visit.  Subjective Assessment - 11/04/19 1612    Subjective  Pt reported she has been feeling better and no hip pain, only tightness. Pt reports she noticed leakage with sneezing. Pt started using estrogen cream ( for one week) and coconut oil externally at the perineum currently which has helped with resolving pain with bowel movements.                    Pelvic Floor Special Questions - 11/05/19 0103    Pelvic Floor Internal Exam  pt consented verbally without contraindications     Exam Type  Vaginal    Palpation  anterior puborectalis mm tightness     Strength  good squeeze, good lift, able to hold agaisnt strong resistance   post Tx: more anterior activation    Strength # of reps  4    Strength # of seconds  1        [facility] Adult PT Treatment/Exercise - 11/05/19 0101      Manual Therapy   Manual therapy comments  fascial mobilization over lower quadrant of abdomen, pubic symphysis  to promote mobility of suprapubic/ pelvic floor  mobility     Internal Pelvic Floor  STM at puborectalis anterior attachments to decrease tensions                   PT Long Term Goals - 11/05/19 0104      PT LONG TERM GOAL #1   Title  Pt will report no burning/ pressure feeling when in relaxing positions and sitting for longer 30 min in softer chairs in order to improve of QOL and breastfeed baby    Time  8    Period  Weeks    Status  Achieved      PT LONG TERM GOAL #2   Title  Pt will demo proper body mechanics to minimize straining of abdomen and pelvic floor with fitness routine (modifications to sit-up/ crunches) ( lifting dumbbells, kettlebells, stand<> floor and sit to stand t/f and lifting 40 lb back bag from ground to shoulders)    Time  10    Period  Weeks    Status  Partially Met      PT LONG TERM GOAL #3   Title  Pt will demo decreased perineal scar  mobility in order to optimize deep core coordination and minimize pain    Time  4    Period  Weeks    Status  Achieved      PT LONG TERM GOAL #4   Title  Pt will increase her PSFS score for relaxing on soft chair 5 pt to > 8pts, and rising with no pain / discomfort 7 pts to 10 pts in order to perform ADLs  ( 08/19/19:  relaxing in soft chair and  rising with no pain / discomfort  10 pts,    Time  10    Period  Weeks    Status  Achieved      PT LONG TERM GOAL #5   Title  Pt will report more hip stability with stairs and demo les genu valgus and medial collpse of ankles and proper feet placement under hips ( wider BOS)  in order to step up on curbs and stairs    Time  5    Period  Weeks    Status  On-going            Plan - 11/04/19 1654    Clinical Impression Statement  Pt demo'd significantly decreased posterior pelvic floor mm but required manual Tx to decrease anterior pelvic floor tensions and abdominopelvic fascial tensions. Pt tolerated manual Tx and demo'd stronger pelvic floor contraction and deep  core coordination afterwards.  Plan to progress to fitness exercises at next session.    Examination-Activity Limitations  Sit    Stability/Clinical Decision Making  Stable/Uncomplicated    Rehab Potential  Good    PT Frequency  1x / week    PT Duration  --   10   PT Treatment/Interventions  Neuromuscular re-education;Moist Heat;Patient/family education;Therapeutic exercise;Therapeutic activities;Gait training;Stair training;Taping;Manual techniques;Balance training;Scar mobilization;Energy conservation    Consulted and Agree with Plan of Care  Patient       Patient will benefit from skilled therapeutic intervention in order to improve the following deficits and impairments:  Improper body mechanics, Pain, Increased muscle spasms, Postural dysfunction, Hypomobility, Hypermobility, Difficulty walking, Decreased range of motion, Decreased endurance, Decreased coordination, Decreased mobility, Decreased activity tolerance, Decreased safety awareness  Visit Diagnosis: Other muscle spasm  Sacrococcygeal disorders, not elsewhere classified  Other lack of coordination     Problem List Patient Active Problem List   Diagnosis Date Noted  . History of vitamin D deficiency 10/02/2019  . IUD (intrauterine device) in place 06/24/2019  . Type 3a perineal laceration during delivery 02/24/2019  . Prediabetes 01/30/2018  . Increased risk of breast cancer 12/24/2016  . Family history of breast cancer 12/24/2016  . Breast mass, right 02/09/2016    Shelia Daniels ,PT, DPT, E-RYT  11/05/2019, 1:07 AM  [place] MAIN [facility] SERVICES [address] [place], [place], [address] Phone: [phone]   Fax:  [phone]  Name: Shelia Daniels MRN: [id number] Date of Birth: 05-05-1986

## 2019-11-16 ENCOUNTER — Ambulatory Visit: Payer: 59 | Admitting: Physical Therapy

## 2019-11-16 ENCOUNTER — Other Ambulatory Visit: Payer: Self-pay

## 2019-11-16 DIAGNOSIS — M533 Sacrococcygeal disorders, not elsewhere classified: Secondary | ICD-10-CM

## 2019-11-16 DIAGNOSIS — M62838 Other muscle spasm: Secondary | ICD-10-CM

## 2019-11-16 DIAGNOSIS — R278 Other lack of coordination: Secondary | ICD-10-CM

## 2019-11-16 NOTE — Therapy (Signed)
[place] MAIN [facility] SERVICES [address] [place], [place], [address] Phone: [phone]   Fax:  [phone]  Physical Therapy Treatment  Patient Details  Name: Shelia Daniels MRN: [id number] Date of Birth: 1985-12-29 Referring Provider (PT): Dani Gobble    Encounter Date: 11/16/2019  PT End of Session - 11/16/19 1719    Visit Number  17    Number of Visits  30    Date for PT Re-Evaluation  12/10/19    PT Start Time  1603    PT Stop Time  1700    PT Time Calculation (min)  57 min    Activity Tolerance  Patient tolerated treatment well    Behavior During Therapy  [facility] for tasks assessed/performed       Past Medical History:  Diagnosis Date  . Family history of breast cancer   . Genetic testing 12/2015   My Risk/BRCA neg  . Increased risk of breast cancer 12/2015   IBIS=21.7%    Past Surgical History:  Procedure Laterality Date  . INTRAUTERINE DEVICE (IUD) INSERTION  05/2014   Mirena    There were no vitals filed for this visit.  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 Pt demo'd jumps today and felt confident and no leakage and little tightness in  B hips.         [facility] PT Assessment - 11/16/19 1722      Lunges   Comments  forward / back ward : R foot ankle instabilty       Step Down   Comments  poor eccentric control, posterior COM       Hopping   Comments  posterior COM       Jumping   Comments  poor eccentric control       Single Leg Stance   Comments  ankle instability       Palpation   Palpation comment  tightness at DAB, adductor transverse head R limited toe abduction/ EV                    [facility] Adult PT Treatment/Exercise - 11/16/19 1720      Therapeutic Activites    Other Therapeutic Activities  advised to hold off on forward/backward lunges to minimize relapse of hip  issues and to focus on R foot/ ankle stability first       Neuro Re-ed    Neuro Re-ed Details   cued for co-activation of other leg in SLS deadlift,  pelvic propioception in SLS on uneven ground, eccentric control step down, anterior COM over BLE in all dynamic balance activities today.       Manual Therapy   Manual therapy comments  STM/MWM DAB, adductor transverse head R to promote toe abduction                  PT Long Term Goals - 11/05/19 0104      PT LONG TERM GOAL #1   Title  Pt will report no burning/ pressure feeling when in relaxing positions and sitting for longer 30 min in softer chairs in order to improve of QOL and breastfeed baby    Time  8    Period  Weeks    Status  Achieved      PT LONG TERM GOAL #2   Title  Pt  will demo proper body mechanics to minimize straining of abdomen and pelvic floor with fitness routine (modifications to sit-up/ crunches) ( lifting dumbbells, kettlebells, stand<> floor and sit to stand t/f and lifting 40 lb back bag from ground to shoulders)    Time  10    Period  Weeks    Status  Partially Met      PT LONG TERM GOAL #3   Title  Pt will demo decreased perineal scar mobility in order to optimize deep core coordination and minimize pain    Time  4    Period  Weeks    Status  Achieved      PT LONG TERM GOAL #4   Title  Pt will increase her PSFS score for relaxing on soft chair 5 pt to > 8pts, and rising with no pain / discomfort 7 pts to 10 pts in order to perform ADLs  ( 08/19/19:  relaxing in soft chair and  rising with no pain / discomfort  10 pts,    Time  10    Period  Weeks    Status  Achieved      PT LONG TERM GOAL #5   Title  Pt will report more hip stability with stairs and demo les genu valgus and medial collpse of ankles and proper feet placement under hips ( wider BOS)  in order to step up on curbs and stairs    Time  5    Period  Weeks    Status  On-going            Plan - 11/16/19 1722    Clinical  Impression Statement Progressed to fitness exercise alignment and techinque with single hopping, jumps, forward/ backward lunges, step down. Pt required cues for co-activation of other leg in SLS deadlift,  pelvic propioception in SLS on uneven ground, eccentric control step down, anterior COM over BLE in all dynamic balance activities today.Manual Tx required to decrease tightness of R adductor hallucis transverse head, DAB mm to promote toe abduction and more transverse arch co-activation in dynamic balance exercises.  Pt demo'd improved ankle propioception and stability post Tx.  Pt's risk for relapse R hip pain and overactivity of pelvic floor mm will be decreased by addressing R lower kinetic chain deficits in fitness exercises.  Pt continues to benefit from skilled PT to achieve remaining goals.    Examination-Activity Limitations  Sit    Stability/Clinical Decision Making  Stable/Uncomplicated    Rehab Potential  Good    PT Frequency  1x / week    PT Duration  --   10   PT Treatment/Interventions  Neuromuscular re-education;Moist Heat;Patient/family education;Therapeutic exercise;Therapeutic activities;Gait training;Stair training;Taping;Manual techniques;Balance training;Scar mobilization;Energy conservation    Consulted and Agree with Plan of Care  Patient       Patient will benefit from skilled therapeutic intervention in order to improve the following deficits and impairments:  Improper body mechanics, Pain, Increased muscle spasms, Postural dysfunction, Hypomobility, Hypermobility, Difficulty walking, Decreased range of motion, Decreased endurance, Decreased coordination, Decreased mobility, Decreased activity tolerance, Decreased safety awareness  Visit Diagnosis: Other muscle spasm  Sacrococcygeal disorders, not elsewhere classified  Other lack of coordination     Problem List Patient Active Problem List   Diagnosis Date Noted  . History of vitamin D deficiency 10/02/2019   . IUD (intrauterine device) in place 06/24/2019  . Type 3a perineal laceration during delivery 02/24/2019  . Prediabetes 01/30/2018  . Increased risk of breast cancer 12/24/2016  .  Family history of breast cancer 12/24/2016  . Breast mass, right 02/09/2016    Jerl Mina ,PT, DPT, E-RYT  11/16/2019, 5:28 PM  [place] MAIN [facility] SERVICES [address] [place], [place], [address] Phone: [phone]   Fax:  [phone]  Name: Shelia Daniels MRN: [id number] Date of Birth: Oct 31, 1985

## 2019-11-16 NOTE — Patient Instructions (Signed)
Step down with slow control Shoulders more forward  10 reps   __  Single leg stance on pillow ( uneven surface)  Count to see how long    Then R step up , L push off, arms out, lean forward  10 reps   __  See saw with blue band Point back toes down, ankle 90 degs   ___  Functional: Single leg balance with pelvic squares when Shelia Daniels is in her stroller,

## 2019-11-29 ENCOUNTER — Other Ambulatory Visit: Payer: Self-pay | Admitting: Surgical

## 2019-11-29 MED ORDER — VITAMIN D (ERGOCALCIFEROL) 1.25 MG (50000 UNIT) PO CAPS: 50000.0000 [IU] | ORAL_CAPSULE | ORAL | 1 refills | Status: DC

## 2019-11-29 NOTE — Telephone Encounter (Signed)
Please send. Thanks, JML

## 2019-11-30 ENCOUNTER — Ambulatory Visit: Payer: 59 | Admitting: Physical Therapy

## 2019-11-30 ENCOUNTER — Other Ambulatory Visit: Payer: Self-pay

## 2019-11-30 DIAGNOSIS — M62838 Other muscle spasm: Secondary | ICD-10-CM

## 2019-11-30 DIAGNOSIS — R278 Other lack of coordination: Secondary | ICD-10-CM

## 2019-11-30 DIAGNOSIS — M533 Sacrococcygeal disorders, not elsewhere classified: Secondary | ICD-10-CM

## 2019-11-30 NOTE — Patient Instructions (Signed)
Bridging series w/ resistive band other side of doorknob:  Level 1:  Position:  Elbows bent, knees hip width apart, heels under knees   Stabilization points: shoulders, upper arms, back of head pressed into floor. Heel press downward.   Movement: inhale do nothing, exhale pull band by side, lower fists to floor completely while lifting hips.Keep stabilization points engaged when you allow the band to go back to starting position  10 x 2 reps       Level 2:  Position:  Elbows straight, arms raised to ceiling at shoulder height, knees apart like a ballerina,heels together, heels under knees  Stabilization points: shoulders, upper arms, back of head pressed into floor. Heel press downward.   Movement: inhale do nothing, exhale pull band by side, lower fists to floor completely while lifting hips. Keep stabilization points engaged when you allow the band to go back to starting position   10 x 2 reps  Shoulder training: Try to imagine you are squeezing a pencil under your armpit and your shoulder blades are down away from your ears and towards each other     __  Cat chasing tail yin pose for hip and back stretch   __  Bird dog with extended knee and toe tucked and touching floor for both feet  Engage in hip of extended leg  opp arm is in a "V" thumbs up  20 reps

## 2019-11-30 NOTE — Therapy (Signed)
[place] MAIN [facility] SERVICES [address] [place], [place], [address] Phone: [phone]   Fax:  [phone]  Physical Therapy Treatment  Patient Details  Name: Shelia Daniels MRN: [id number] Date of Birth: [birth year] Referring Provider (PT): Dani Gobble    Encounter Date: 11/30/2019    Past Medical History:  Diagnosis Date  . Family history of breast cancer   . Genetic testing 12/2015   My Risk/BRCA neg  . Increased risk of breast cancer 12/2015   IBIS=21.7%    Past Surgical History:  Procedure Laterality Date  . INTRAUTERINE DEVICE (IUD) INSERTION  05/2014   Mirena    There were no vitals filed for this visit.  Subjective Assessment - 11/30/19 1607    Subjective  Pt reports she had no issues with stairs and no pinching of her hips. Pt has been feeling tight in her back         [facility] PT Assessment - 11/30/19 1722      Coordination   Gross Motor Movements are Fluid and Coordinated  --   scapular stabilization cues                   [facility] Adult PT Treatment/Exercise - 11/30/19 1720      Neuro Re-ed    Neuro Re-ed Details   cued for bridges without overuse of gluts , cued for scapulothroacic stabilization in new HEP       Exercises   Exercises  Other Exercises    Other Exercises   quad and back stretches ( see pt instructions)                   PT Long Term Goals - 11/30/19 1608      PT LONG TERM GOAL #1   Title  Pt will report no burning/ pressure feeling when in relaxing positions and sitting for longer 30 min in softer chairs in order to improve of QOL and breastfeed baby    Time  8    Period  Weeks    Status  Achieved      PT LONG TERM GOAL #2   Title  Pt will demo proper body mechanics to minimize straining of abdomen and pelvic floor with fitness routine (modifications to sit-up/ crunches) ( lifting dumbbells, kettlebells, stand<> floor and sit to stand t/f and lifting 40 lb back  bag from ground to shoulders)    Time  10    Period  Weeks    Status  Partially Met      PT LONG TERM GOAL #3   Title  Pt will demo decreased perineal scar mobility in order to optimize deep core coordination and minimize pain    Time  4    Period  Weeks    Status  Achieved      PT LONG TERM GOAL #4   Title  Pt will increase her PSFS score for relaxing on soft chair 5 pt to > 8pts, and rising with no pain / discomfort 7 pts to 10 pts in order to perform ADLs  ( 08/19/19:  relaxing in soft chair and  rising with no pain / discomfort  10 pts,    Time  10    Period  Weeks    Status  Achieved      PT LONG TERM GOAL #5   Title  Pt will report more hip stability with stairs and demo les genu valgus and medial collpse of ankles  and proper feet placement under hips ( wider BOS)  in order to step up on curbs and stairs    Time  5    Period  Weeks    Status  On-going            Plan - 11/30/19 1720    Clinical Impression Statement Pt progressed to thoracolumbar strengthening with cues to perform bridges without overuse of glut mm. Pt also required cues for scapulothoracic stabilization. Pt continues to have no hip issues the past weeks. Added quad stretches into HEP. Plan to provide more education to fitness exercises to minimize overactivity of mm.  Pt continues to benefit from skilled PT    Examination-Activity Limitations  Sit    Stability/Clinical Decision Making  Stable/Uncomplicated    Rehab Potential  Good    PT Frequency  1x / week    PT Duration  --   10   PT Treatment/Interventions  Neuromuscular re-education;Moist Heat;Patient/family education;Therapeutic exercise;Therapeutic activities;Gait training;Stair training;Taping;Manual techniques;Balance training;Scar mobilization;Energy conservation    Consulted and Agree with Plan of Care  Patient       Patient will benefit from skilled therapeutic intervention in order to improve the following deficits and impairments:   Improper body mechanics, Pain, Increased muscle spasms, Postural dysfunction, Hypomobility, Hypermobility, Difficulty walking, Decreased range of motion, Decreased endurance, Decreased coordination, Decreased mobility, Decreased activity tolerance, Decreased safety awareness  Visit Diagnosis: Sacrococcygeal disorders, not elsewhere classified  Other muscle spasm  Other lack of coordination     Problem List Patient Active Problem List   Diagnosis Date Noted  . History of vitamin D deficiency 10/02/2019  . IUD (intrauterine device) in place 06/24/2019  . Type 3a perineal laceration during delivery 02/24/2019  . Prediabetes 01/30/2018  . Increased risk of breast cancer 12/24/2016  . Family history of breast cancer 12/24/2016  . Breast mass, right 02/09/2016    Jerl Mina ,PT, DPT, E-RYT  11/30/2019, 5:24 PM  [place] MAIN [facility] SERVICES [address] [place], [place], [address] Phone: [phone]   Fax:  [phone]  Name: Shelia Daniels MRN: [id number] Date of Birth: Jun 24, 1986

## 2019-12-16 ENCOUNTER — Ambulatory Visit: Payer: 59 | Attending: Certified Nurse Midwife | Admitting: Physical Therapy

## 2019-12-16 ENCOUNTER — Other Ambulatory Visit: Payer: Self-pay

## 2019-12-16 DIAGNOSIS — R278 Other lack of coordination: Secondary | ICD-10-CM | POA: Diagnosis present

## 2019-12-16 DIAGNOSIS — M62838 Other muscle spasm: Secondary | ICD-10-CM | POA: Diagnosis present

## 2019-12-16 DIAGNOSIS — M533 Sacrococcygeal disorders, not elsewhere classified: Secondary | ICD-10-CM

## 2019-12-16 NOTE — Therapy (Signed)
[place] MAIN [facility] SERVICES [address] [place], [place], [address] Phone: [phone]   Fax:  [phone]  Physical Therapy Treatment / Progress Note  Patient Details  Name: Shelia Daniels MRN: [id number] Date of Birth: 20-Apr-1986 Referring Provider (PT): Dani Gobble    Encounter Date: 12/16/2019  PT End of Session - 12/16/19 2054    Visit Number  19    Number of Visits  30    Date for PT Re-Evaluation  02/24/20    PT Start Time  1703    PT Stop Time  1753    PT Time Calculation (min)  50 min    Activity Tolerance  Patient tolerated treatment well    Behavior During Therapy  [facility] for tasks assessed/performed       Past Medical History:  Diagnosis Date  . Family history of breast cancer   . Genetic testing 12/2015   My Risk/BRCA neg  . Increased risk of breast cancer 12/2015   IBIS=21.7%    Past Surgical History:  Procedure Laterality Date  . INTRAUTERINE DEVICE (IUD) INSERTION  05/2014   Mirena    There were no vitals filed for this visit.  Subjective Assessment - 12/16/19 2104    Subjective  Pt reports she would like to stretch her hip flexors         [facility] PT Assessment - 12/16/19 2100      Posture/Postural Control   Posture Comments  decreased weight bearing in BLE with diassociation bewteen trunk/pelvis exercises                     [facility] Adult PT Treatment/Exercise - 12/16/19 2100      Neuro Re-ed    Neuro Re-ed Details   cued for diassociation between trunk / pelvis       Exercises   Other Exercises   cued for hip flexor stretch , functional core exercises with weight bearing propioception                   PT Long Term Goals - 12/16/19 2055      PT LONG TERM GOAL #1   Title  Pt will report no burning/ pressure feeling when in relaxing positions and sitting for longer 30 min in softer chairs in order to improve of QOL and breastfeed baby    Time  8    Period  Weeks    Status  Achieved      PT LONG TERM GOAL #2   Title  Pt will demo proper body mechanics to minimize straining of abdomen and pelvic floor with fitness routine (modifications to sit-up/ crunches) ( lifting dumbbells, kettlebells, stand<> floor and sit to stand t/f and lifting 40 lb back bag from ground to shoulders)    Time  10    Period  Weeks    Status  Partially Met      PT LONG TERM GOAL #3   Title  Pt will demo decreased perineal scar mobility in order to optimize deep core coordination and minimize pain    Time  4    Period  Weeks    Status  Achieved      PT LONG TERM GOAL #4   Title  Pt will increase her PSFS score for relaxing on soft chair 5 pt to > 8pts, and rising with no pain / discomfort 7 pts to 10 pts in order to perform ADLs  ( 08/19/19:  relaxing  in soft chair and  rising with no pain / discomfort  10 pts,    Time  10    Period  Weeks    Status  Achieved      PT LONG TERM GOAL #5   Title  Pt will report more hip stability with stairs and demo les genu valgus and medial collpse of ankles and proper feet placement under hips ( wider BOS)  in order to step up on curbs and stairs    Time  5    Period  Weeks    Status  On-going            Plan - 12/16/19 2055    Clinical Impression Statement  Pt has achieved 3/5 goals and is progressing well towards remaining goals. Pt's perineal scar restrictions have decreased significantly and demonstrates improved pelvic floor and deep core coordination and strength. Pt 's hip pain has improved with certain movement and stairs.   Pt has progressed to fitness phase and is learning ways to minimize relapse of hip pain and pelvic pain with modifications to core and gym activities. Pt required cues for diassociation between trunk/ pelvis/ LE. Pt will benefit  from skilled PT for one more visit before d/c.    Examination-Activity Limitations  Sit    Stability/Clinical Decision Making  Stable/Uncomplicated    Rehab Potential  Good    PT  Frequency  1x / week    PT Duration  --   10   PT Treatment/Interventions  Neuromuscular re-education;Moist Heat;Patient/family education;Therapeutic exercise;Therapeutic activities;Gait training;Stair training;Taping;Manual techniques;Balance training;Scar mobilization;Energy conservation    Consulted and Agree with Plan of Care  Patient       Patient will benefit from skilled therapeutic intervention in order to improve the following deficits and impairments:  Improper body mechanics, Pain, Increased muscle spasms, Postural dysfunction, Hypomobility, Hypermobility, Difficulty walking, Decreased range of motion, Decreased endurance, Decreased coordination, Decreased mobility, Decreased activity tolerance, Decreased safety awareness  Visit Diagnosis: Sacrococcygeal disorders, not elsewhere classified  Other muscle spasm  Other lack of coordination     Problem List Patient Active Problem List   Diagnosis Date Noted  . History of vitamin D deficiency 10/02/2019  . IUD (intrauterine device) in place 06/24/2019  . Type 3a perineal laceration during delivery 02/24/2019  . Prediabetes 01/30/2018  . Increased risk of breast cancer 12/24/2016  . Family history of breast cancer 12/24/2016  . Breast mass, right 02/09/2016    Jerl Mina 12/16/2019, 9:09 PM  [place] MAIN [facility] SERVICES [address] [place], [place], [address] Phone: [phone]   Fax:  [phone]  Name: Camara Renstrom MRN: [id number] Date of Birth: 1985/08/18

## 2019-12-16 NOTE — Patient Instructions (Signed)
Multifidis twist  Band is on doorknob: stand further away from door (facing perpendicular)   Twisting trunk without moving the hips and knees Hold band at the level of ribcage, elbows bent,shoulder blades roll back and down like squeezing a pencil under armpit    Exhale twist,.10-15 deg away from door without moving your hips/ knees. Continue to maintain equal weight through legs. Keep knee unlocked.  10 x 2    __  Tug -A War with front knee strong   ___ Stretches   Hip flexor lying on back, leg dropped off , trunk in a diagonal position, foot propped  Weights on groin   sidelying quad stretch with strap around ankle      Side of hip stretch:  Reclined twist for hips and side of the hips/ legs  Lay on your back, knees bend Scoot hips to the R , leave shoulders in place Drop knees to the L side resting onto pillows to keep leg at the same width of hips Pillow under L thigh to minimize too much strain

## 2019-12-30 ENCOUNTER — Ambulatory Visit: Payer: 59 | Admitting: Physical Therapy

## 2020-01-12 ENCOUNTER — Other Ambulatory Visit: Payer: Self-pay

## 2020-01-12 ENCOUNTER — Ambulatory Visit: Payer: 59 | Attending: Certified Nurse Midwife | Admitting: Physical Therapy

## 2020-01-12 DIAGNOSIS — M533 Sacrococcygeal disorders, not elsewhere classified: Secondary | ICD-10-CM | POA: Diagnosis present

## 2020-01-12 DIAGNOSIS — R278 Other lack of coordination: Secondary | ICD-10-CM | POA: Insufficient documentation

## 2020-01-12 DIAGNOSIS — M62838 Other muscle spasm: Secondary | ICD-10-CM | POA: Insufficient documentation

## 2020-01-13 NOTE — Therapy (Signed)
[place] MAIN [facility] SERVICES [address] [place], [place], [address] Phone: [phone]   Fax:  [phone]  Physical Therapy Treatment / Discharge Summary   Patient Details  Name: Shelia Daniels MRN: [id number] Date of Birth: 1986/01/17 Referring Provider (PT): Dani Gobble    Encounter Date: 01/12/2020   PT End of Session - 01/12/20 0812    Visit Number 20    Number of Visits 30    Date for PT Re-Evaluation 02/24/20    PT Start Time 0806    PT Stop Time 0900    PT Time Calculation (min) 54 min    Activity Tolerance Patient tolerated treatment well    Behavior During Therapy [facility] for tasks assessed/performed           Past Medical History:  Diagnosis Date  . Family history of breast cancer   . Genetic testing 12/2015   My Risk/BRCA neg  . Increased risk of breast cancer 12/2015   IBIS=21.7%    Past Surgical History:  Procedure Laterality Date  . INTRAUTERINE DEVICE (IUD) INSERTION  05/2014   Mirena    There were no vitals filed for this visit.   Subjective Assessment - 01/12/20 0810    Subjective Pt feels alot better with her hips. When pt is relaxing on the couch or bed, pt feels she has leakage with coughing. When sitting up, she has no problem with leakage with coughing. Pt would like to review the deep core              [facility] PT Assessment - 01/13/20 1150      Other:   Other/ Comments minor cues for HEP       Palpation   SI assessment  SIJ mobility present                          [facility] Adult PT Treatment/Exercise - 01/13/20 1150      Therapeutic Activites    Other Therapeutic Activities discussed goals and d/c       Neuro Re-ed    Neuro Re-ed Details  reviewed HEP                        PT Long Term Goals - 01/12/20 7035      PT LONG TERM GOAL #1   Title Pt will report no burning/ pressure feeling when in relaxing positions and sitting for longer 30 min in softer  chairs in order to improve of QOL and breastfeed baby    Time 8    Period Weeks    Status Achieved      PT LONG TERM GOAL #2   Title Pt will demo proper body mechanics to minimize straining of abdomen and pelvic floor with fitness routine (modifications to sit-up/ crunches) ( lifting dumbbells, kettlebells, stand<> floor and sit to stand t/f and lifting 40 lb back bag from ground to shoulders)    Time 10    Period Weeks    Status Achieved      PT LONG TERM GOAL #3   Title Pt will demo decreased perineal scar mobility in order to optimize deep core coordination and minimize pain    Time 4    Period Weeks    Status Achieved      PT LONG TERM GOAL #4   Title Pt will increase her PSFS score for relaxing on soft chair 5 pt  to > 8pts, and rising with no pain / discomfort 7 pts to 10 pts in order to perform ADLs  ( 08/19/19:  relaxing in soft chair and  rising with no pain / discomfort  10 pts,    Time 10    Period Weeks    Status Achieved      PT LONG TERM GOAL #5   Title Pt will report more hip stability with stairs and demo les genu valgus and medial collpse of ankles and proper feet placement under hips ( wider BOS)  in order to step up on curbs and stairs    Time 5    Period Weeks    Status Achieved                 Plan - 01/12/20 0623    Clinical Impression Statement Across the past 20 visits, pt has achieved 100% of her goals and is ready for d/c. Pt  No longer has burning/ pressure feeling when in relaxing positions and sitting for longer 30 min in softer chairs, no longer have pinching in her hips and able to ascend stairs and sit on the floor with her baby, and return to fitness exercises without relapse of Sx. Pt's perineal scar from her 3rd degree tear has decreased in restrictions and pt is able to lengthen her pelvic floor for better bowel elimination. Pt's deep core and pelvic floor strength and coordination have improved and her hip / glut mm are less tight. Pt's  standing posture no longer has increased lordosis at lumbar spine and genu valgus/ hyperextension of knees have improved. Pt is ready for d/c at this time.     Examination-Activity Limitations Sit    Stability/Clinical Decision Making Stable/Uncomplicated    Rehab Potential Good    PT Frequency 1x / week    PT Duration --   10   PT Treatment/Interventions Neuromuscular re-education;Moist Heat;Patient/family education;Therapeutic exercise;Therapeutic activities;Gait training;Stair training;Taping;Manual techniques;Balance training;Scar mobilization;Energy conservation    Consulted and Agree with Plan of Care Patient           Patient will benefit from skilled therapeutic intervention in order to improve the following deficits and impairments:  Improper body mechanics, Pain, Increased muscle spasms, Postural dysfunction, Hypomobility, Hypermobility, Difficulty walking, Decreased range of motion, Decreased endurance, Decreased coordination, Decreased mobility, Decreased activity tolerance, Decreased safety awareness  Visit Diagnosis: Sacrococcygeal disorders, not elsewhere classified  Other muscle spasm  Other lack of coordination     Problem List Patient Active Problem List   Diagnosis Date Noted  . History of vitamin D deficiency 10/02/2019  . IUD (intrauterine device) in place 06/24/2019  . Type 3a perineal laceration during delivery 02/24/2019  . Prediabetes 01/30/2018  . Increased risk of breast cancer 12/24/2016  . Family history of breast cancer 12/24/2016  . Breast mass, right 02/09/2016    Jerl Mina 01/13/2020, 11:52 AM  [place] MAIN [facility] SERVICES [address] [place], [place], [address] Phone: [phone]   Fax:  [phone]  Name: Shelia Daniels MRN: [id number] Date of Birth: 01-16-1986

## 2020-03-16 IMAGING — US US BREAST*R* LIMITED INC AXILLA
1 series · 5 of 5 positions shown · non-contrast
Comparison: Previous exam(s).

CLINICAL DATA: Short-term follow-up right breast mass. Patient has
family history of breast cancer, grandmother with breast cancer in
her 38Abdolkabir Hajja breast cancer risk of 21.7%.

EXAM:
DIGITAL DIAGNOSTIC BILATERAL MAMMOGRAM WITH CAD
ULTRASOUND RIGHT BREAST

[Series 1: us breast*right* limited inc axilla · 0.06mm/px · 5 of 5 slices shown]
[im 1/5]
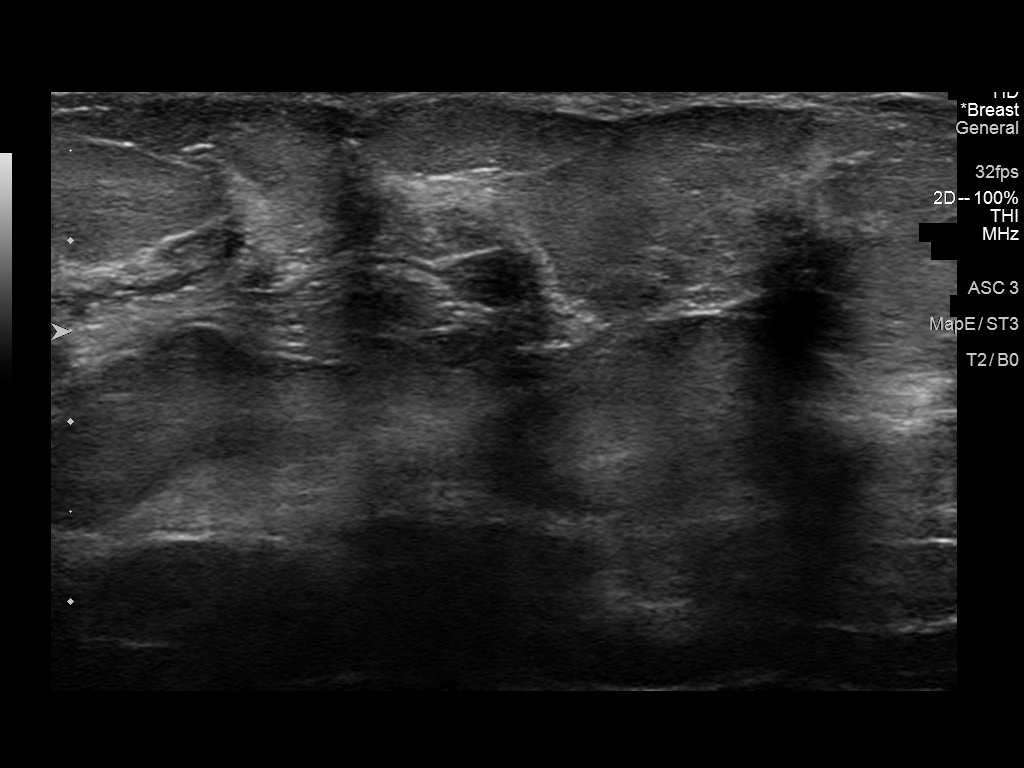
[im 2/5]
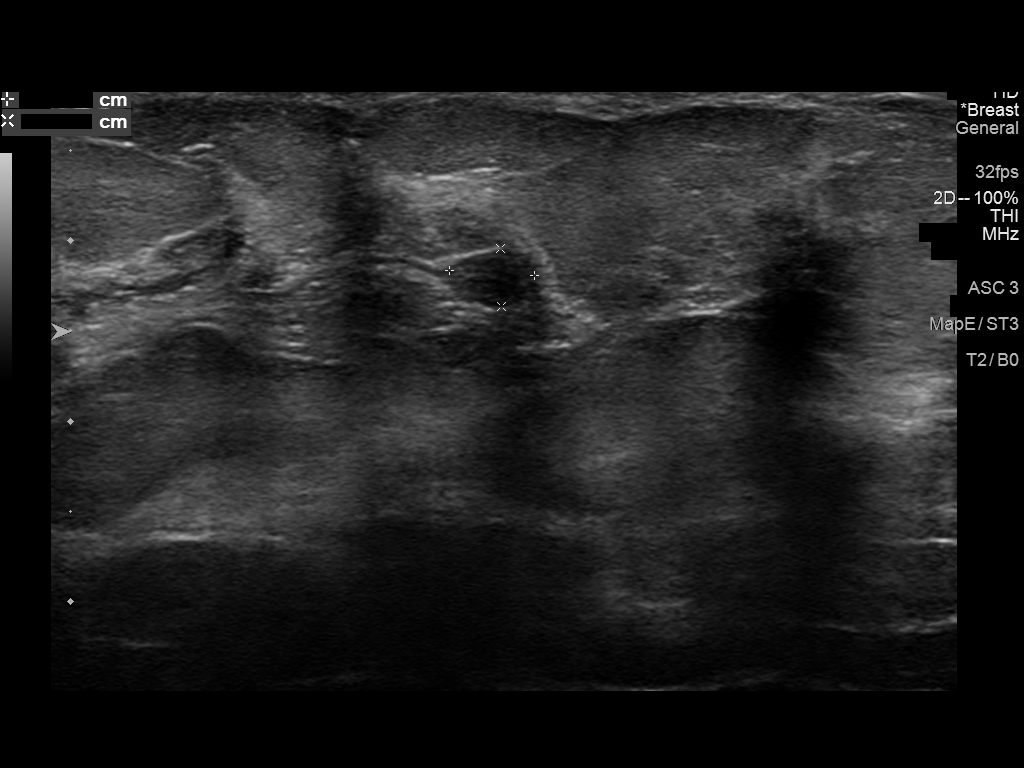
[im 3/5]
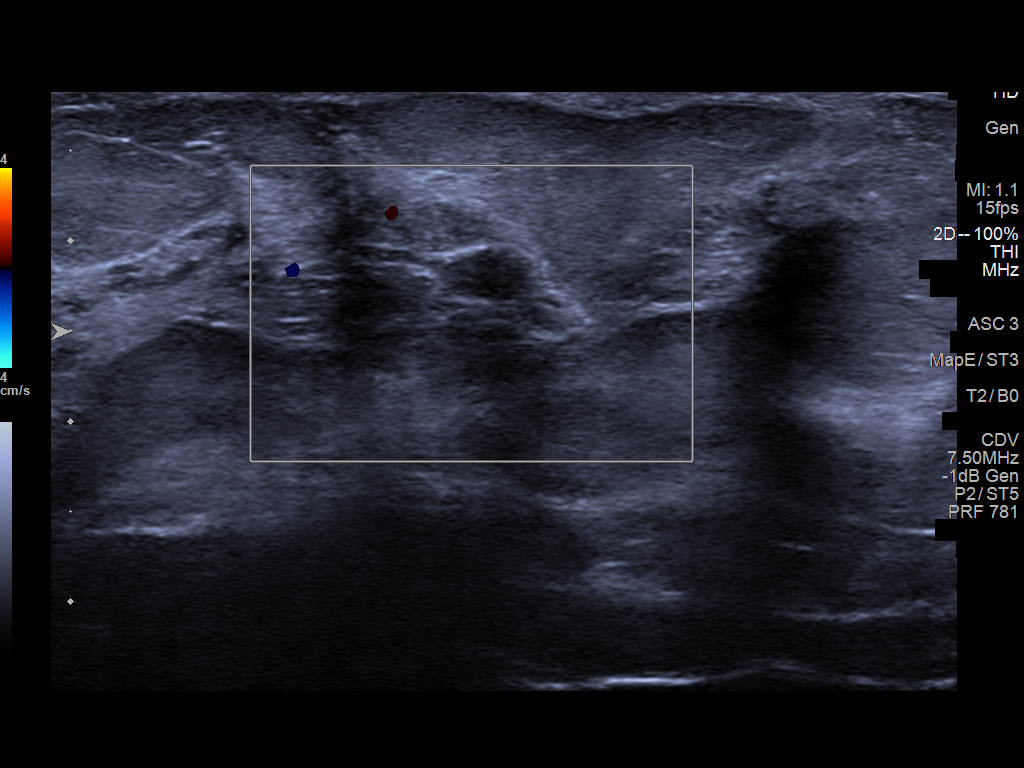
[im 4/5]
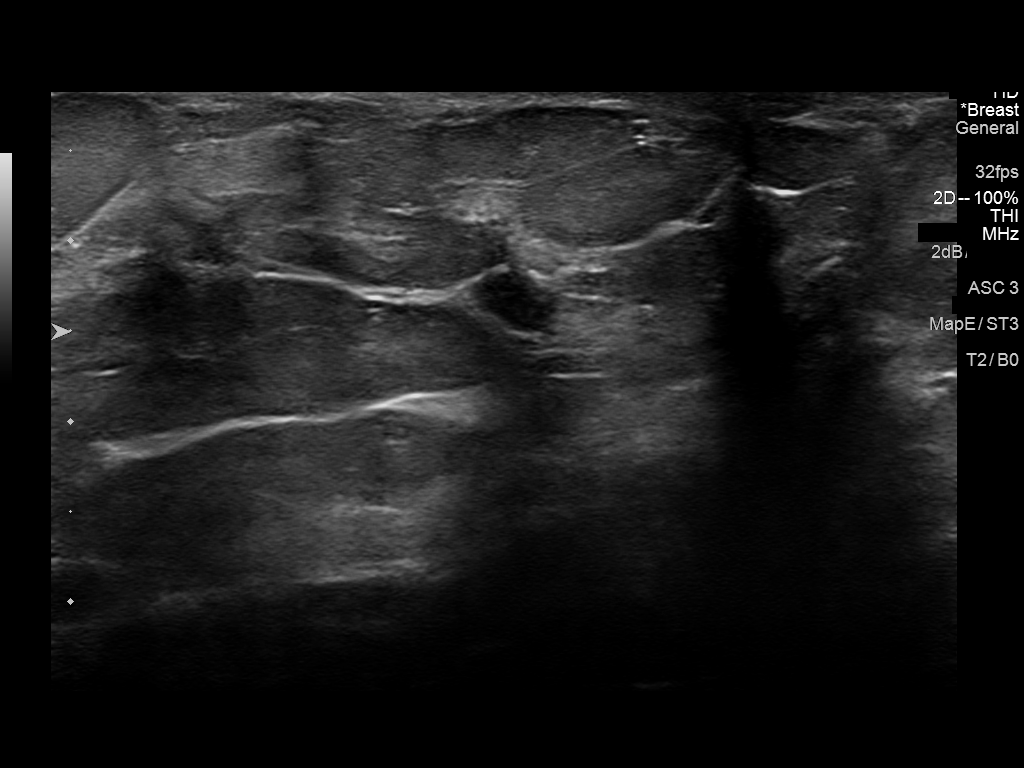
[im 5/5]
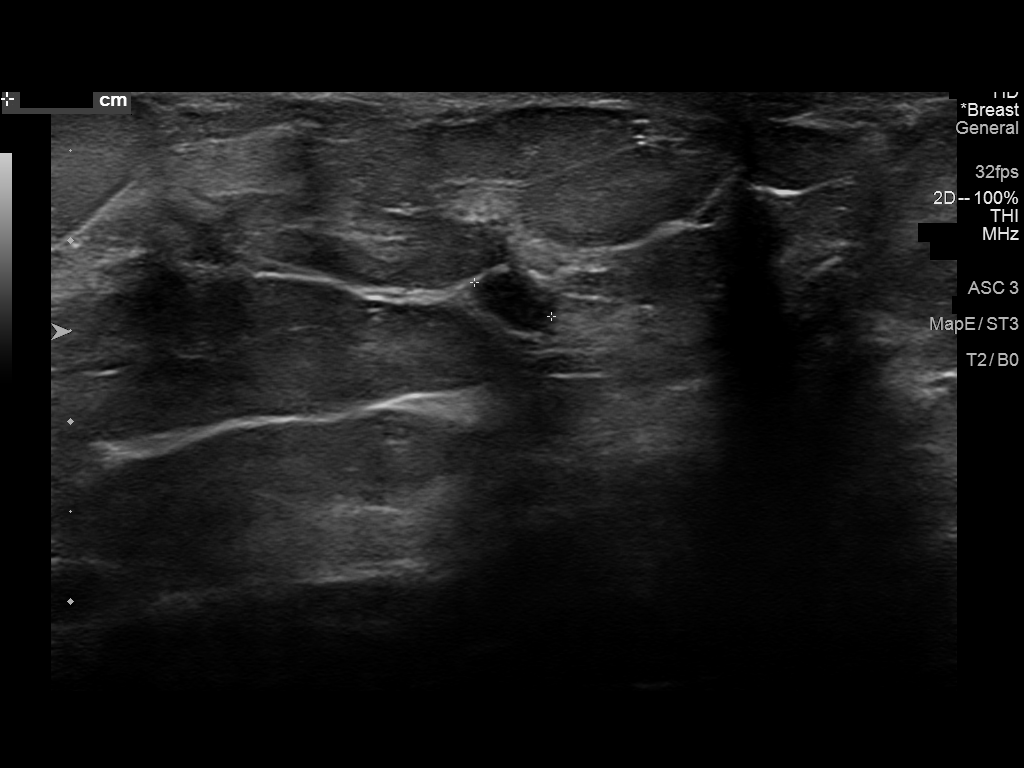

[5 of 5 positions shown; findings below may reference images not displayed]

ACR Breast Density Category b: There are scattered areas of
fibroglandular density.
FINDINGS: Cc MLO views of bilateral breasts are submitted. No suspicious
abnormalities identified bilaterally.

Mammographic images were processed with CAD.

Targeted ultrasound is performed, showing 0.47 cm oval hypoechoic
lesion at the right breast retroareolar 1 o'clock slightly smaller
compared to prior ultrasound February 09, 2016.
IMPRESSION: Benign findings.

RECOMMENDATION:
Bilateral screening mammogram in 1 year.

I have discussed the findings and recommendations with the patient.
Results were also provided in writing at the conclusion of the
visit. If applicable, a reminder letter will be sent to the patient
regarding the next appointment.

BI-RADS CATEGORY  2: Benign.

## 2020-04-14 ENCOUNTER — Other Ambulatory Visit: Payer: 59

## 2020-05-04 ENCOUNTER — Other Ambulatory Visit: Payer: Self-pay | Admitting: Certified Nurse Midwife

## 2020-05-12 ENCOUNTER — Other Ambulatory Visit: Payer: Self-pay

## 2020-05-22 ENCOUNTER — Other Ambulatory Visit: Payer: Self-pay

## 2020-05-22 ENCOUNTER — Other Ambulatory Visit: Payer: 59

## 2020-05-22 DIAGNOSIS — R739 Hyperglycemia, unspecified: Secondary | ICD-10-CM

## 2020-05-22 DIAGNOSIS — O99210 Obesity complicating pregnancy, unspecified trimester: Secondary | ICD-10-CM

## 2020-05-22 DIAGNOSIS — O99019 Anemia complicating pregnancy, unspecified trimester: Secondary | ICD-10-CM

## 2020-05-22 DIAGNOSIS — Z8639 Personal history of other endocrine, nutritional and metabolic disease: Secondary | ICD-10-CM

## 2020-05-22 DIAGNOSIS — Z87898 Personal history of other specified conditions: Secondary | ICD-10-CM

## 2020-05-23 LAB — LIPID PANEL
Chol/HDL Ratio: 4.3 ratio (ref 0.0–4.4)
Cholesterol, Total: 187 mg/dL (ref 100–199)
HDL: 43 mg/dL (ref >39)
LDL Chol Calc (NIH): 126 mg/dL — ABNORMAL HIGH (ref 0–99)
Triglycerides: 99 mg/dL (ref 0–149)
VLDL Cholesterol Cal: 18 mg/dL (ref 5–40)

## 2020-05-23 LAB — CBC
Hematocrit: 39.8 % (ref 34.0–46.6)
Hemoglobin: 12.8 g/dL (ref 11.1–15.9)
MCH: 27.2 pg (ref 26.6–33.0)
MCHC: 32.2 g/dL (ref 31.5–35.7)
MCV: 85 fL (ref 79–97)
Platelets: 300 10*3/uL (ref 150–450)
RBC: 4.7 x10E6/uL (ref 3.77–5.28)
RDW: 13.7 % (ref 11.7–15.4)
WBC: 5.8 10*3/uL (ref 3.4–10.8)

## 2020-05-23 LAB — VITAMIN D 25 HYDROXY (VIT D DEFICIENCY, FRACTURES): Vit D, 25-Hydroxy: 27.9 ng/mL — ABNORMAL LOW (ref 30.0–100.0)

## 2020-05-23 LAB — THYROID PANEL WITH TSH
Free Thyroxine Index: 1.6 (ref 1.2–4.9)
T3 Uptake Ratio: 22 % — ABNORMAL LOW (ref 24–39)
T4, Total: 7.2 ug/dL (ref 4.5–12.0)
TSH: 2.29 u[IU]/mL (ref 0.450–4.500)

## 2020-05-23 LAB — HEMOGLOBIN A1C
Est. average glucose Bld gHb Est-mCnc: 123 mg/dL
Hgb A1c MFr Bld: 5.9 % — ABNORMAL HIGH (ref 4.8–5.6)

## 2020-10-06 ENCOUNTER — Other Ambulatory Visit: Payer: Self-pay

## 2020-10-06 ENCOUNTER — Encounter: Payer: Managed Care, Other (non HMO) | Admitting: Certified Nurse Midwife

## 2020-10-06 ENCOUNTER — Encounter: Payer: Self-pay | Admitting: Certified Nurse Midwife

## 2020-10-06 ENCOUNTER — Ambulatory Visit (INDEPENDENT_AMBULATORY_CARE_PROVIDER_SITE_OTHER): Payer: 59 | Admitting: Certified Nurse Midwife

## 2020-10-06 VITALS — BP 107/79 | HR 69 | Ht 66.0 in | Wt 246.2 lb

## 2020-10-06 DIAGNOSIS — Z1231 Encounter for screening mammogram for malignant neoplasm of breast: Secondary | ICD-10-CM

## 2020-10-06 DIAGNOSIS — Z975 Presence of (intrauterine) contraceptive device: Secondary | ICD-10-CM | POA: Diagnosis not present

## 2020-10-06 DIAGNOSIS — Z01419 Encounter for gynecological examination (general) (routine) without abnormal findings: Secondary | ICD-10-CM

## 2020-10-06 DIAGNOSIS — Z9189 Other specified personal risk factors, not elsewhere classified: Secondary | ICD-10-CM

## 2020-10-06 NOTE — Progress Notes (Signed)
Annual Exam-Pt stated that she was doing well.  

## 2020-10-06 NOTE — Patient Instructions (Addendum)
Levonorgestrel intrauterine device (IUD) What is this medicine? LEVONORGESTREL IUD (LEE voe nor jes trel) is a contraceptive (birth control) device. The device is placed inside the uterus by a health care provider. It is used to prevent pregnancy. Some devices can also be used to treat heavy bleeding that occurs during your period. This medicine may be used for other purposes; ask your health care provider or pharmacist if you have questions. COMMON BRAND NAME(S): Kyleena, LILETTA, Mirena, Skyla What should I tell my health care provider before I take this medicine? They need to know if you have any of these conditions:  abnormal Pap smear  cancer of the breast, uterus, or cervix  diabetes  endometritis  genital or pelvic infection now or in the past  have more than one sexual partner or your partner has more than one partner  heart disease  history of an ectopic or tubal pregnancy  immune system problems  IUD in place  liver disease or tumor  problems with blood clots or take blood-thinners  seizures  use intravenous drugs  uterus of unusual shape  vaginal bleeding that has not been explained  an unusual or allergic reaction to levonorgestrel, other hormones, silicone, or polyethylene, medicines, foods, dyes, or preservatives  pregnant or trying to get pregnant  breast-feeding How should I use this medicine? This device is placed inside the uterus by a health care professional. Talk to your pediatrician regarding the use of this medicine in children. Special care may be needed. Overdosage: If you think you have taken too much of this medicine contact a poison control center or emergency room at once. NOTE: This medicine is only for you. Do not share this medicine with others. What if I miss a dose? This does not apply. Depending on the brand of device you have inserted, the device will need to be replaced every 3 to 7 years if you wish to continue using this type  of birth control. What may interact with this medicine? Do not take this medicine with any of the following medications:  amprenavir  bosentan  fosamprenavir This medicine may also interact with the following medications:  aprepitant  armodafinil  barbiturate medicines for inducing sleep or treating seizures  bexarotene  boceprevir  griseofulvin  medicines to treat seizures like carbamazepine, ethotoin, felbamate, oxcarbazepine, phenytoin, topiramate  modafinil  pioglitazone  rifabutin  rifampin  rifapentine  some medicines to treat HIV infection like atazanavir, efavirenz, indinavir, lopinavir, nelfinavir, tipranavir, ritonavir  St. John's wort  warfarin This list may not describe all possible interactions. Give your health care provider a list of all the medicines, herbs, non-prescription drugs, or dietary supplements you use. Also tell them if you smoke, drink alcohol, or use illegal drugs. Some items may interact with your medicine. What should I watch for while using this medicine? Visit your doctor or health care professional for regular check ups. See your doctor if you or your partner has sexual contact with others, becomes HIV positive, or gets a sexual transmitted disease. This product does not protect you against HIV infection (AIDS) or other sexually transmitted diseases. You can check the placement of the IUD yourself by reaching up to the top of your vagina with clean fingers to feel the threads. Do not pull on the threads. It is a good habit to check placement after each menstrual period. Call your doctor right away if you feel more of the IUD than just the threads or if you cannot feel the threads   at all. The IUD may come out by itself. You may become pregnant if the device comes out. If you notice that the IUD has come out use a backup birth control method like condoms and call your health care provider. Using tampons will not change the position of the  IUD and are okay to use during your period. This IUD can be safely scanned with magnetic resonance imaging (MRI) only under specific conditions. Before you have an MRI, tell your healthcare provider that you have an IUD in place, and which type of IUD you have in place. What side effects may I notice from receiving this medicine? Side effects that you should report to your doctor or health care professional as soon as possible:  allergic reactions like skin rash, itching or hives, swelling of the face, lips, or tongue  fever, flu-like symptoms  genital sores  high blood pressure  no menstrual period for 6 weeks during use  pain, swelling, warmth in the leg  pelvic pain or tenderness  severe or sudden headache  signs of pregnancy  stomach cramping  sudden shortness of breath  trouble with balance, talking, or walking  unusual vaginal bleeding, discharge  yellowing of the eyes or skin Side effects that usually do not require medical attention (report to your doctor or health care professional if they continue or are bothersome):  acne  breast pain  change in sex drive or performance  changes in weight  cramping, dizziness, or faintness while the device is being inserted  headache  irregular menstrual bleeding within first 3 to 6 months of use  nausea This list may not describe all possible side effects. Call your doctor for medical advice about side effects. You may report side effects to FDA at 1-800-FDA-1088. Where should I keep my medicine? This does not apply. NOTE: This sheet is a summary. It may not cover all possible information. If you have questions about this medicine, talk to your doctor, pharmacist, or health care provider.  2021 Elsevier/Gold Standard (2020-03-21 16:27:45)   Preventive Care 52-85 Years Old, Female Preventive care refers to lifestyle choices and visits with your health care provider that can promote health and wellness. This  includes:  A yearly physical exam. This is also called an annual wellness visit.  Regular dental and eye exams.  Immunizations.  Screening for certain conditions.  Healthy lifestyle choices, such as: ? Eating a healthy diet. ? Getting regular exercise. ? Not using drugs or products that contain nicotine and tobacco. ? Limiting alcohol use. What can I expect for my preventive care visit? Physical exam Your health care provider may check your:  Height and weight. These may be used to calculate your BMI (body mass index). BMI is a measurement that tells if you are at a healthy weight.  Heart rate and blood pressure.  Body temperature.  Skin for abnormal spots. Counseling Your health care provider may ask you questions about your:  Past medical problems.  Family's medical history.  Alcohol, tobacco, and drug use.  Emotional well-being.  Home life and relationship well-being.  Sexual activity.  Diet, exercise, and sleep habits.  Work and work Statistician.  Access to firearms.  Method of birth control.  Menstrual cycle.  Pregnancy history. What immunizations do I need? Vaccines are usually given at various ages, according to a schedule. Your health care provider will recommend vaccines for you based on your age, medical history, and lifestyle or other factors, such as travel or where you  work.   What tests do I need? Blood tests  Lipid and cholesterol levels. These may be checked every 5 years starting at age 37.  Hepatitis C test.  Hepatitis B test. Screening  Diabetes screening. This is done by checking your blood sugar (glucose) after you have not eaten for a while (fasting).  STD (sexually transmitted disease) testing, if you are at risk.  BRCA-related cancer screening. This may be done if you have a family history of breast, ovarian, tubal, or peritoneal cancers.  Pelvic exam and Pap test. This may be done every 3 years starting at age 78. Starting  at age 16, this may be done every 5 years if you have a Pap test in combination with an HPV test. Talk with your health care provider about your test results, treatment options, and if necessary, the need for more tests.   Follow these instructions at home: Eating and drinking  Eat a healthy diet that includes fresh fruits and vegetables, whole grains, lean protein, and low-fat dairy products.  Take vitamin and mineral supplements as recommended by your health care provider.  Do not drink alcohol if: ? Your health care provider tells you not to drink. ? You are pregnant, may be pregnant, or are planning to become pregnant.  If you drink alcohol: ? Limit how much you have to 0-1 drink a day. ? Be aware of how much alcohol is in your drink. In the [place] one drink equals one 12 oz bottle of beer (355 mL), one 5 oz glass of wine (148 mL), or one 1 oz glass of hard liquor (44 mL).   Lifestyle  Take daily care of your teeth and gums. Brush your teeth every morning and night with fluoride toothpaste. Floss one time each day.  Stay active. Exercise for at least 30 minutes 5 or more days each week.  Do not use any products that contain nicotine or tobacco, such as cigarettes, e-cigarettes, and chewing tobacco. If you need help quitting, ask your health care provider.  Do not use drugs.  If you are sexually active, practice safe sex. Use a condom or other form of protection to prevent STIs (sexually transmitted infections).  If you do not wish to become pregnant, use a form of birth control. If you plan to become pregnant, see your health care provider for a prepregnancy visit.  Find healthy ways to cope with stress, such as: ? Meditation, yoga, or listening to music. ? Journaling. ? Talking to a trusted person. ? Spending time with friends and family. Safety  Always wear your seat belt while driving or riding in a vehicle.  Do not drive: ? If you have been drinking alcohol. Do not  ride with someone who has been drinking. ? When you are tired or distracted. ? While texting.  Wear a helmet and other protective equipment during sports activities.  If you have firearms in your house, make sure you follow all gun safety procedures.  Seek help if you have been physically or sexually abused. What's next?  Go to your health care provider once a year for an annual wellness visit.  Ask your health care provider how often you should have your eyes and teeth checked.  Stay up to date on all vaccines. This information is not intended to replace advice given to you by your health care provider. Make sure you discuss any questions you have with your health care provider. Document Revised: 03/19/2020 Document Reviewed: 04/02/2018 Elsevier Patient  Education  2021 [place] Breast self-awareness is knowing how your breasts look and feel. Doing breast self-awareness is important. It allows you to catch a breast problem early while it is still small and can be treated. All women should do breast self-awareness, including women who have had breast implants. Tell your doctor if you notice a change in your breasts. What you need:  A mirror.  A well-lit room. How to do a breast self-exam A breast self-exam is one way to learn what is normal for your breasts and to check for changes. To do a breast self-exam: Look for changes 1. Take off all the clothes above your waist. 2. Stand in front of a mirror in a room with good lighting. 3. Put your hands on your hips. 4. Push your hands down. 5. Look at your breasts and nipples in the mirror to see if one breast or nipple looks different from the other. Check to see if: ? The shape of one breast is different. ? The size of one breast is different. ? There are wrinkles, dips, and bumps in one breast and not the other. 6. Look at each breast for changes in the skin, such as: ? Redness. ? Scaly areas. 7. Look  for changes in your nipples, such as: ? Liquid around the nipples. ? Bleeding. ? Dimpling. ? Redness. ? A change in where the nipples are.   Feel for changes 1. Lie on your back on the floor. 2. Feel each breast. To do this, follow these steps: ? Pick a breast to feel. ? Put the arm closest to that breast above your head. ? Use your other arm to feel the nipple area of your breast. Feel the area with the pads of your three middle fingers by making small circles with your fingers. For the first circle, press lightly. For the second circle, press harder. For the third circle, press even harder. ? Keep making circles with your fingers at the different pressures as you move down your breast. Stop when you feel your ribs. ? Move your fingers a little toward the center of your body. ? Start making circles with your fingers again, this time going up until you reach your collarbone. ? Keep making up-and-down circles until you reach your armpit. Remember to keep using the three pressures. ? Feel the other breast in the same way. 3. Sit or stand in the tub or shower. 4. With soapy water on your skin, feel each breast the same way you did in step 2 when you were lying on the floor.   Write down what you find Writing down what you find can help you remember what to tell your doctor. Write down:  What is normal for each breast.  Any changes you find in each breast, including: ? The kind of changes you find. ? Whether you have pain. ? Size and location of any lumps.  When you last had your menstrual period. General tips  Check your breasts every month.  If you are breastfeeding, the best time to check your breasts is after you feed your baby or after you use a breast pump.  If you get menstrual periods, the best time to check your breasts is 5-7 days after your menstrual period is over.  With time, you will become comfortable with the self-exam, and you will begin to know if there are changes  in your breasts. Contact a doctor if you:  See a change in  the shape or size of your breasts or nipples.  See a change in the skin of your breast or nipples, such as red or scaly skin.  Have fluid coming from your nipples that is not normal.  Find a lump or thick area that was not there before.  Have pain in your breasts.  Have any concerns about your breast health. Summary  Breast self-awareness includes looking for changes in your breasts, as well as feeling for changes within your breasts.  Breast self-awareness should be done in front of a mirror in a well-lit room.  You should check your breasts every month. If you get menstrual periods, the best time to check your breasts is 5-7 days after your menstrual period is over.  Let your doctor know of any changes you see in your breasts, including changes in size, changes on the skin, pain or tenderness, or fluid from your nipples that is not normal. This information is not intended to replace advice given to you by your health care provider. Make sure you discuss any questions you have with your health care provider. Document Revised: 03/10/2018 Document Reviewed: 03/10/2018 Elsevier Patient Education  [place].

## 2020-10-06 NOTE — Progress Notes (Signed)
ANNUAL PREVENTATIVE CARE GYN  ENCOUNTER NOTE  Subjective:       Shelia Daniels is a 35 y.o. G43P1001 female here for a routine annual gynecologic exam.   Declines ANNUAL labs. Will schedule mammogram after completion of child led weaning.   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   Denies difficulty breathing or respiratory distress, chest pain, abdominal pain, vaginal bleeding, dysuria, and leg pain or swelling.    Gynecologic History  No LMP recorded. (Menstrual status: IUD).  Contraception: IUD, Mirena  Last Pap: 10/01/2019. Results were: Neg/Neg  Last mammogram: 03/05/2017. Results were: normal, BI-RADS 2  Obstetric History  OB History  Gravida Para Term Preterm AB Living  _0 0 0 1  SAB IAB Ectopic Multiple Live Births  0 0 0 0 1    # Outcome Date GA Lbr Len/2nd Weight Sex Delivery Anes PTL Lv  1 Term 02/24/19 [redacted]w[redacted]d/ 00:37 8 lb 0.4 oz (3.64 kg) F Vag-Spont EPI  LIV    Past Medical History:  Diagnosis Date  . Family history of breast cancer   . Genetic testing 12/2015   My Risk/BRCA neg  . Increased risk of breast cancer 12/2015   IBIS=21.7%    Past Surgical History:  Procedure Laterality Date  . INTRAUTERINE DEVICE (IUD) INSERTION  05/2014   Mirena    Current Outpatient Medications on File Prior to Visit  Medication Sig Dispense Refill  . fluticasone (FLONASE) 50 MCG/ACT nasal spray Place 2 sprays into both nostrils as needed for allergies or rhinitis.    .Marland Kitchenlevonorgestrel (MIRENA) 20 MCG/24HR IUD 1 each by Intrauterine route once.    . loratadine (CLARITIN) 10 MG tablet Take 10 mg by mouth as needed.     . Prenatal MV-Min-Fe Fum-FA-DHA (PRENATAL+DHA PO)     . Vitamin D, Ergocalciferol, (DRISDOL) 1.25 MG (50000 UNIT) CAPS capsule TAKE 1 CAPSULE EVERY 7 DAYS 4 capsule 0   No current facility-administered medications on file prior to visit.    Allergies  Allergen Reactions  . Pollen Extract Other (See Comments)     Sneezing, watery eyes, seasonal    Social History   Socioeconomic History  . Marital status: Married    Spouse name: Not on file  . Number of children: Not on file  . Years of education: Not on file  . Highest education level: Not on file  Occupational History  . Not on file  Tobacco Use  . Smoking status: Never Smoker  . Smokeless tobacco: Never Used  Vaping Use  . Vaping Use: Never used  Substance and Sexual Activity  . Alcohol use: Not Currently    Comment: occasional   . Drug use: No  . Sexual activity: Not Currently    Birth control/protection: I.U.D.  Other Topics Concern  . Not on file  Social History Narrative  . Not on file   Social Determinants of Health   Financial Resource Strain: Not on file  Food Insecurity: Not on file  Transportation Needs: Not on file  Physical Activity: Not on file  Stress: Not on file  Social Connections: Not on file  Intimate Partner Violence: Not on file    Family History  Problem Relation Age of Onset  . Breast cancer Paternal Grandmother 478 . Pancreatic cancer Paternal Grandmother   . Diabetes Paternal Grandmother   . Diabetes Mother   . Hyperlipidemia Mother   . Hypertension Mother   . Diabetes Father   .  Heart attack Father   . Hyperlipidemia Maternal Uncle   . Hypertension Maternal Uncle   . Hyperlipidemia Maternal Grandfather   . Hypertension Maternal Grandfather   . Lymphoma Paternal Grandfather   . Ovarian cancer Neg Hx   . Colon cancer Neg Hx     The following portions of the patient's history were reviewed and updated as appropriate: allergies, current medications, past family history, past medical history, past social history, past surgical history and problem list.  Review of Systems  ROS negative except as noted above. Information obtained from patient.    Objective:   BP 107/79   Pulse 69   Ht _0  (1.676 m)   Wt 246 lb 3.2 oz (111.7 kg)   BMI 39.74 kg/m    CONSTITUTIONAL: Well-developed,  well-nourished female in no acute distress.   PSYCHIATRIC: Normal mood and affect. Normal behavior. Normal judgment and thought content.  Egan: Alert and oriented to person, place, and time. Normal muscle tone coordination. No cranial nerve deficit noted.  HENT:  Normocephalic, atraumatic.   EYES: Conjunctivae and EOM are normal.   NECK: Normal range of motion, supple, no masses.  Normal thyroid.   SKIN: Skin is warm and dry. No rash noted. Not diaphoretic. No erythema. No pallor.  CARDIOVASCULAR: Normal heart rate noted, regular rhythm, no murmur.  RESPIRATORY: Clear to auscultation bilaterally.  Effort and breath sounds normal, no problems with respiration noted.  BREASTS: Symmetric in size. No masses, skin changes, nipple drainage, or lymphadenopathy. Lactating.   ABDOMEN: Soft, normal bowel sounds, no distention noted.  No tenderness, rebound or guarding.   PELVIC:  External Genitalia: Normal  Vagina: Normal  Cervix: Normal, IUD strings present  Uterus: Normal  Adnexa: Normal  MUSCULOSKELETAL: Normal range of motion. No tenderness.  No cyanosis, clubbing, or edema.  2+ distal pulses.  LYMPHATIC: No Axillary, Supraclavicular, or Inguinal Adenopathy.  Assessment:   Annual gynecologic examination 35 y.o.   Contraception: IUD, Mirena   Obesity 2   Problem List Items Addressed This Visit      Other   Increased risk of breast cancer   Relevant Orders   MM 3D SCREEN BREAST BILATERAL   IUD (intrauterine device) in place    Other Visit Diagnoses    Well woman exam    -  Primary   Relevant Orders   MM 3D SCREEN BREAST BILATERAL   Lactating mother       Screening mammogram for breast cancer       Relevant Orders   MM 3D SCREEN BREAST BILATERAL      Plan:   Pap: Not needed  Mammogram: Ordered  Labs: Declined  Routine preventative health maintenance measures emphasized: Exercise/Diet/Weight control, Tobacco Warnings, Alcohol/Substance use risks and  Stress Management; see AVS  Reviewed red flag symptoms and when to call  Return to [place] for [employer] or sooner if needed   Dani Gobble, CNM  [facility], [facility] 10/06/20 9:32 AM

## 2021-04-30 ENCOUNTER — Telehealth: Payer: 59 | Admitting: Obstetrics and Gynecology

## 2021-04-30 NOTE — Telephone Encounter (Signed)
Patient states she had her annual done in March of 2022 by Serafina Royals and at that time Marcelino Duster ordered her mammogram.  She got busy with things and called today to have it schedule and they would not take the order that [facility] sent.  Patient is requesting that another order be sent

## 2021-05-04 ENCOUNTER — Other Ambulatory Visit: Payer: Self-pay

## 2021-05-04 DIAGNOSIS — Z1231 Encounter for screening mammogram for malignant neoplasm of breast: Secondary | ICD-10-CM

## 2021-05-04 DIAGNOSIS — Z9189 Other specified personal risk factors, not elsewhere classified: Secondary | ICD-10-CM

## 2021-05-04 NOTE — Telephone Encounter (Signed)
Pt called no answer left message by voicemail that her mammogram order had been placed. Also sent mychart message.

## 2021-05-09 NOTE — Telephone Encounter (Signed)
Please see mychart messages. Pt is aware.

## 2021-09-28 ENCOUNTER — Encounter: Payer: 59 | Admitting: Certified Nurse Midwife

## 2022-01-12 ENCOUNTER — Emergency Department
Admission: EM | Admit: 2022-01-12 | Discharge: 2022-01-12 | Disposition: A | Discharge status: Home / Self Care | Payer: 59 | Attending: Emergency Medicine | Admitting: Emergency Medicine

## 2022-01-12 ENCOUNTER — Encounter: Payer: Self-pay | Admitting: Emergency Medicine

## 2022-01-12 ENCOUNTER — Other Ambulatory Visit: Payer: Self-pay

## 2022-01-12 DIAGNOSIS — H0289 Other specified disorders of eyelid: Secondary | ICD-10-CM | POA: Diagnosis present

## 2022-01-12 DIAGNOSIS — B309 Viral conjunctivitis, unspecified: Secondary | ICD-10-CM | POA: Diagnosis not present

## 2022-01-12 DIAGNOSIS — H01004 Unspecified blepharitis left upper eyelid: Secondary | ICD-10-CM | POA: Diagnosis not present

## 2022-01-12 MED ORDER — FLUORESCEIN SODIUM 1 MG OP STRP
1.0000 | ORAL_STRIP | Freq: Once | OPHTHALMIC | Status: AC
  Administered 2022-01-12: 1 via OPHTHALMIC
  Filled 2022-01-12: qty 1

## 2022-01-12 MED ORDER — KETOROLAC TROMETHAMINE 0.4 % OP SOLN: 1.0000 [drp] | Freq: Four times a day (QID) | OPHTHALMIC | 0 refills | Status: DC

## 2022-01-12 MED ORDER — PREDNISONE 20 MG PO TABS: 20.0000 mg | ORAL_TABLET | Freq: Two times a day (BID) | ORAL | 0 refills | Status: AC

## 2022-01-12 NOTE — Discharge Instructions (Addendum)
Your symptoms appear consistent with a viral conjunctivitis.  You are being treated with an anti-inflammatory eyedrop for the eye.  You may also dose the oral steroid as directed.  Use cool compress to help reduce swelling.  Follow-up with your eye care specialist, [facility], or return to the ED if needed.

## 2022-01-12 NOTE — ED Triage Notes (Signed)
Pt via POV from home. Pt c/o L eye pain, swelling, and redness that started Monday. States she has been on antibiotics for 2 days and it has been unchanged. Denies fever. Denies blurred vision. States the pain is worse on palpation. Pt is A&OX4 and NAD

## 2022-01-13 NOTE — ED Provider Notes (Signed)
[facility] Emergency Department Provider Note     Event Date/Time   First MD Initiated Contact with Patient 01/12/22 1925     (approximate)   History   Conjunctivitis   HPI  Shelia Daniels is a 36 y.o. female presents to the ED for evaluation of ongoing left thigh upper lid swelling and redness that began on Monday.  Patient has been on oral antibiotics for 2 days and has also been using a Polytrim solution, but denies any significant benefit or change her symptoms but she denies any fevers, blurry vision, vision loss, or headaches.  She also denies any significant purulent drainage.  Patient does report a tender nodule in front of the left ear.     Physical Exam   Triage Vital Signs: ED Triage Vitals  Enc Vitals Group     BP 01/12/22 1729 140/90     Pulse Rate 01/12/22 1729 98     Resp 01/12/22 1729 18     Temp 01/12/22 1729 99 F (37.2 C)     Temp src --      SpO2 01/12/22 1729 99 %     Weight 01/12/22 1728 250 lb (113.4 kg)     Height 01/12/22 1728 5\' 6"  (1.676 m)     Head Circumference --      Peak Flow --      Pain Score 01/12/22 1727 0     Pain Loc --      Pain Edu? --      Excl. in GC? --     Most recent vital signs: Vitals:   01/12/22 1729  BP: 140/90  Pulse: 98  Resp: 18  Temp: 99 F (37.2 C)  SpO2: 99%    General Awake, no distress. NAD HEENT NCAT. PERRL. EOMI. No rhinorrhea.  Left eye with significant conjunctival injection noted.  There is blepharitis noted to the upper lid.  No fluorescein dye uptake is appreciated no gross foreign bodies noted.  Mucous membranes are moist.  She is noted to have a tender palpable preauricular node on the left. CV:  Good peripheral perfusion.  RESP:  Normal effort.  ABD:  No distention.    ED Results / Procedures / Treatments   Labs (all labs ordered are listed, but only abnormal results are displayed) Labs Reviewed - No data to display   EKG   RADIOLOGY   No results  found.   PROCEDURES:  Critical Care performed: No  Procedures   MEDICATIONS ORDERED IN ED: Medications  fluorescein ophthalmic strip 1 strip (1 strip Left Eye Given by Other 01/12/22 2027)     IMPRESSION / MDM / ASSESSMENT AND PLAN / ED COURSE  I reviewed the triage vital signs and the nursing notes.                              Differential diagnosis includes, but is not limited to, viral conjunctivitis, bacterial conjunctivitis, allergic conjunctivitis, abrasion, corneal foreign body  Patient's presentation is most consistent with acute, uncomplicated illness.  Patient's diagnosis is consistent with viral conjunctivitis. Patient will be discharged home with prescriptions for Acular and prednisone. Patient is to follow up with [facility] as needed or otherwise directed. Patient is given ED precautions to return to the ED for any worsening or new symptoms.     FINAL CLINICAL IMPRESSION(S) / ED DIAGNOSES   Final diagnoses:  Acute viral conjunctivitis of  left eye  Blepharitis of left upper eyelid, unspecified type     Rx / DC Orders   ED Discharge Orders          Ordered    ketorolac (ACULAR) 0.4 % SOLN  4 times daily        01/12/22 2047    predniSONE (DELTASONE) 20 MG tablet  2 times daily with meals        01/12/22 2047             Note:  This document was prepared using Dragon voice recognition software and may include unintentional dictation errors.    Melvenia Needles, PA-C 01/13/22 0019    Carrie Mew, MD 01/13/22 [phone]

## 2022-03-13 ENCOUNTER — Ambulatory Visit: Payer: 59 | Admitting: Dermatology

## 2022-03-13 DIAGNOSIS — L0291 Cutaneous abscess, unspecified: Secondary | ICD-10-CM

## 2022-03-13 DIAGNOSIS — L02219 Cutaneous abscess of trunk, unspecified: Secondary | ICD-10-CM | POA: Diagnosis not present

## 2022-03-13 DIAGNOSIS — L732 Hidradenitis suppurativa: Secondary | ICD-10-CM | POA: Diagnosis not present

## 2022-03-13 MED ORDER — MUPIROCIN 2 % EX OINT: TOPICAL_OINTMENT | CUTANEOUS | 1 refills | Status: DC

## 2022-03-13 MED ORDER — DOXYCYCLINE MONOHYDRATE 100 MG PO CAPS: 100.0000 mg | ORAL_CAPSULE | Freq: Two times a day (BID) | ORAL | 2 refills | Status: DC

## 2022-03-13 MED ORDER — MUPIROCIN 2 % EX OINT: TOPICAL_OINTMENT | CUTANEOUS | 1 refills | Status: AC

## 2022-03-13 MED ORDER — CLINDAMYCIN PHOSPHATE 1 % EX FOAM
CUTANEOUS | 3 refills | Status: AC
Start: 2022-03-13 — End: ?

## 2022-03-13 NOTE — Patient Instructions (Addendum)
Hidradenitis Suppurativa is a chronic; persistent; non-curable, but treatable condition due to abnormal inflamed sweat glands in the body folds (axilla, inframammary, groin, medial thighs), causing recurrent painful draining cysts and scarring. It can be associated with severe scarring acne and cysts; also abscesses and scarring of scalp. The goal is control and prevention of flares, as it is not curable. Scars are permanent and can be thickened. Treatment may include daily use of topical medication and oral antibiotics.  Oral isotretinoin may also be helpful.  For more severe cases, Humira (a biologic injection) may be prescribed to decrease the inflammatory process and prevent flares.  When indicated, inflamed cysts may also be treated surgically.   Start doxycycline 100mg  take 1 capsule by mouth twice daily with food x 2 weeks, then decrease to once daily for maintenance. May increase to twice daily for future flares.  Doxycycline should be taken with food to prevent nausea. Do not lay down for 30 minutes after taking. Be cautious with sun exposure and use good sun protection while on this medication. Pregnant women should not take this medication.   Start clindamycin foam - Apply to affected areas once daily after shower.  Recommend OTC benzoyl peroxide cleanser, wash affected areas daily in shower, let sit several minutes prior to rinsing.  May bleach towels if not rinsed off completely.  Recommended brands include Panoxyl 4% Creamy Wash, CeraVe Acne Foaming Cream wash, or Cetaphil Gentle Clear Complexion-Clearing BPO Acne Cleanser.  Start mupirocin 2% ointment Apply twice daily to open areas until healed.   Due to recent changes in healthcare laws, you may see results of your pathology and/or laboratory studies on MyChart before the doctors have had a chance to review them. We understand that in some cases there may be results that are confusing or concerning to you. Please understand that not all  results are received at the same time and often the doctors may need to interpret multiple results in order to provide you with the best plan of care or course of treatment. Therefore, we ask that you please give 2 business days to thoroughly review all your results before contacting the office for clarification. Should we see a critical lab result, you will be contacted sooner.   If You Need Anything After Your Visit  If you have any questions or concerns for your doctor, please call our main line at [phone] and press option 4 to reach your doctor's medical assistant. If no one answers, please leave a voicemail as directed and we will return your call as soon as possible. Messages left after 4 pm will be answered the following business day.   You may also send [phone] a message via MyChart. We typically respond to MyChart messages within 1-2 business days.  For prescription refills, please ask your pharmacy to contact our office. Our fax number is [phone].  If you have an urgent issue when the clinic is closed that cannot wait until the next business day, you can page your doctor at the number below.    Please note that while we do our best to be available for urgent issues outside of office hours, we are not available 24/7.   If you have an urgent issue and are unable to reach [phone], you may choose to seek medical care at your doctor's office, retail clinic, urgent care center, or emergency room.  If you have a medical emergency, please immediately call 911 or go to the emergency department.  Pager Numbers  -  Dr. Gwen Pounds: [phone]  - Dr. Neale Burly: [phone]  - Dr. Roseanne Reno: [phone]  In the event of inclement weather, please call our main line at [phone] for an update on the status of any delays or closures.  Dermatology Medication Tips: Please keep the boxes that topical medications come in in order to help keep track of the instructions about where and how to use  these. Pharmacies typically print the medication instructions only on the boxes and not directly on the medication tubes.   If your medication is too expensive, please contact our office at [phone] option 4 or send [place] a message through MyChart.   We are unable to tell what your co-pay for medications will be in advance as this is different depending on your insurance coverage. However, we may be able to find a substitute medication at lower cost or fill out paperwork to get insurance to cover a needed medication.   If a prior authorization is required to get your medication covered by your insurance company, please allow [place] 1-2 business days to complete this process.  Drug prices often vary depending on where the prescription is filled and some pharmacies may offer cheaper prices.  The website www.goodrx.com contains coupons for medications through different pharmacies. The prices here do not account for what the cost may be with help from insurance (it may be cheaper with your insurance), but the website can give you the price if you did not use any insurance.  - You can print the associated coupon and take it with your prescription to the pharmacy.  - You may also stop by our office during regular business hours and pick up a GoodRx coupon card.  - If you need your prescription sent electronically to a different pharmacy, notify our office through [facility] or by phone at [phone] option 4.     Si Usted Necesita Algo Despus de Su Visita  Tambin puede enviarnos un mensaje a travs de Clinical cytogeneticist. Por lo general respondemos a los mensajes de MyChart en el transcurso de 1 a 2 das hbiles.  Para renovar recetas, por favor pida a su farmacia que se ponga en contacto con nuestra oficina. Annie Sable de fax es [place] [phone].  Si tiene un asunto urgente cuando la clnica est cerrada y que no puede esperar hasta el siguiente da hbil, puede llamar/localizar a su doctor(a) al  nmero que aparece a continuacin.   Por favor, tenga en cuenta que aunque hacemos todo lo posible para estar disponibles para asuntos urgentes fuera del horario de [place], no estamos disponibles las 24 horas del da, los 7 [address]  [address] de la [place].   Si tiene un problema urgente y no puede comunicarse con nosotros, puede optar por buscar atencin mdica  en el consultorio de su doctor(a), en una clnica privada, en un centro de atencin urgente o en una sala de emergencias.  Si tiene Engineer, drilling, por favor llame inmediatamente al 911 o vaya a la sala de emergencias.  Nmeros de bper  - Dr. Gwen Pounds: [phone]  - Dra. Moye: [phone]  - Dra. Roseanne Reno: [phone]  En caso de inclemencias del [place], por favor llame a Lacy Duverney principal al [phone] para una actualizacin sobre el [facility] de cualquier retraso o cierre.  Consejos para la medicacin en dermatologa: Por favor, guarde las cajas en las que vienen los medicamentos de uso tpico para ayudarle a seguir las instrucciones sobre dnde y cmo usarlos. Las farmacias generalmente imprimen las instrucciones del medicamento  slo en las cajas y no directamente en los tubos del medicamento.   Si su medicamento es muy caro, por favor, pngase en contacto con Rolm Gala llamando al [phone] y presione la opcin 4 o envenos un mensaje a travs de Clinical cytogeneticist.   No podemos decirle cul ser su copago por los medicamentos por adelantado ya que esto es diferente dependiendo de la cobertura de su seguro. Sin embargo, es posible que podamos encontrar un medicamento sustituto a Audiological scientist un formulario para que el seguro cubra el medicamento que se considera necesario.   Si se requiere una autorizacin previa para que su compaa de seguros [place] su medicamento, por favor permtanos de 1 a 2 das hbiles para completar [address]  Los precios de los medicamentos varan con frecuencia dependiendo del Environmental consultant de  dnde se surte la receta y alguna farmacias pueden ofrecer precios ms baratos.  El sitio web www.goodrx.com tiene cupones para medicamentos de Health and safety inspector. Los precios aqu no tienen en cuenta lo que podra costar con la ayuda del seguro (puede ser ms barato con su seguro), pero el sitio web puede darle el precio si no utiliz Tourist information centre manager.  - Puede imprimir el cupn correspondiente y llevarlo con su receta a la farmacia.  - Tambin puede pasar por nuestra oficina durante el horario de atencin regular y Education officer, museum una tarjeta de cupones de GoodRx.  - Si necesita que su receta se enve electrnicamente a una farmacia diferente, informe a nuestra oficina a travs de MyChart de [place] o por telfono llamando al [phone] y presione la opcin 4.

## 2022-03-13 NOTE — Progress Notes (Signed)
New Patient Visit  Subjective  Shelia Daniels is a 36 y.o. female who presents for the following: Lumps (Bil flank and groin off and on x 6 months. Painful when they come up and then burst. She has flares every couple of weeks. She has been to urgent care and was treated with doxycycline for 10 days several months ago.). Daughter also getting boils.  The following portions of the chart were reviewed this encounter and updated as appropriate:       Review of Systems:  No other skin or systemic complaints except as noted in HPI or Assessment and Plan.  Objective  Well appearing patient in no apparent distress; mood and affect are within normal limits.  A focused examination was performed including trunk, groin. Relevant physical exam findings are noted in the Assessment and Plan.  bilateral flank, suprapubic Violaceous scars on left flank; resolving inflammatory papule on R flank with violaceous macules and papules; draining erythematous nodule on the suprapubic.    Assessment & Plan  Hidradenitis suppurativa bilateral flank, suprapubic  With Abscess.  R/o MRSA. Chronic and persistent condition with duration or expected duration over one year. Condition is bothersome/symptomatic for patient. Currently flared.   Hidradenitis Suppurativa is a chronic; persistent; non-curable, but treatable condition due to abnormal inflamed sweat glands in the body folds (axilla, inframammary, groin, medial thighs), causing recurrent painful draining cysts and scarring. It can be associated with severe scarring acne and cysts; also abscesses and scarring of scalp. The goal is control and prevention of flares, as it is not curable. Scars are permanent and can be thickened. Treatment may include daily use of topical medication and oral antibiotics.  Oral isotretinoin may also be helpful.  For more severe cases, Humira (a biologic injection) may be prescribed to decrease the inflammatory process and prevent  flares.  When indicated, inflamed cysts may also be treated surgically.  Bacterial Culture performed today followed by I & D of suprapubic abcess.   Start Mupirocin 2% Ointment Apply to open areas and cover twice daily until healed dsp 28g 1Rf.  Start doxycycline 100mg  take 1 po BID with food x 2 weeks, then decrease to QD dsp #60 2Rf. May increase to BID with future flares.  Doxycycline should be taken with food to prevent nausea. Do not lay down for 30 minutes after taking. Be cautious with sun exposure and use good sun protection while on this medication. Pregnant women should not take this medication.   Start clindamycin foam Apply to AA QD after shower dsp 50g 3Rf.  Recommend OTC benzoyl peroxide cleanser, wash affected areas daily in shower, let sit several minutes prior to rinsing.  May bleach towels if not rinsed off completely.  Recommended brands include Panoxyl 4% Creamy Wash, CeraVe Acne Foaming Cream wash, or Cetaphil Gentle Clear Complexion-Clearing BPO Acne Cleanser.   Incision and Drainage - bilateral flank, suprapubic Location: suprapubic  Informed Consent: Discussed risks (permanent scarring, light or dark discoloration, infection, pain, bleeding, bruising, redness, damage to adjacent structures, and recurrence of the lesion) and benefits of the procedure, as well as the alternatives.  Informed consent was obtained.  Preparation: The area was prepped with alcohol.  Anesthesia: Lidocaine 1% with epinephrine  Procedure Details: An incision was made overlying the lesion. The lesion drained pus and blood.  A moderate amount of pus was drained.    Antibiotic ointment and a sterile pressure dressing were applied. The patient tolerated procedure well.  Total number of lesions drained: 1  Plan: The patient was instructed on post-op care. Recommend OTC analgesia as needed for pain.   Clindamycin Phosphate foam - bilateral flank, suprapubic Apply to affected areas once daily  after shower.  doxycycline (MONODOX) 100 MG capsule - bilateral flank, suprapubic Take 1 capsule (100 mg total) by mouth 2 (two) times daily.  Related Procedures Anaerobic and Aerobic Culture  Related Medications mupirocin ointment (BACTROBAN) 2 % Apply to open areas and cover twice daily until improved.  Abscess   Return in about 2 months (around 05/13/2022) for HS.  ICherlyn Labella, CMA, am acting as scribe for Willeen Niece, MD .  Documentation: I have reviewed the above documentation for accuracy and completeness, and I agree with the above.  Willeen Niece MD

## 2022-03-20 ENCOUNTER — Ambulatory Visit: Payer: 59 | Admitting: Dermatology

## 2022-03-20 ENCOUNTER — Telehealth: Payer: Self-pay

## 2022-03-20 LAB — ANAEROBIC AND AEROBIC CULTURE

## 2022-03-20 MED ORDER — CLINDAMYCIN HCL 300 MG PO CAPS: 300.0000 mg | ORAL_CAPSULE | Freq: Three times a day (TID) | ORAL | 0 refills | Status: AC

## 2022-03-20 NOTE — Telephone Encounter (Signed)
Advised per Dr Roseanne Reno:  "MRSA abscess, resistant to doxycycline and sulfa antibiotics.  She needs to stop the doxycycline and send in clindamycin 300 mg PO tid x 7 days.  Risk GI upset, diarrhea, call clinic if these symptoms are severe. (Dr S originally wrote 450mg  BID x 7 days, but 450mg  capsule not available)   MRSA is a highly contagious bacterial infection.  Take oral antibiotic as prescribed.  After cleansing infected area, apply mupirocin ointment and cover with bandaid twice daily.  Frequent hand washing is recommended and wash hands after wound care.  Recommend OTC Hibiclens as body wash (including skin folds, avoiding face/eye/ear area) daily in shower, let sit couple minutes prior to rinsing.  Continue clindamycin foam twice daily to aas body. Apply mupirocin ointment in the nostrils bid 7-10 days.  Soak toothbrush in Listerine nightly. Gargle with Listerine mouthwash daily. Do not reuse washcloths. Wash clothes and towels after 1 use. Do not share towels. Disinfect combs/brushes.    If she gets another abscess, it should be cultured again to r/out staph recurrence.  Hidradenitis abcesses are not caused by infection/bacteria and are instead due to inflammation of the sweat gland.  So it may be she has been having recurring staph abcesses and not HS abscesses."  A copy of this also sent to patient's MyChart. Rx sent to [facility].

## 2022-03-20 NOTE — Telephone Encounter (Signed)
-----   Message from Willeen Niece, MD sent at 03/20/2022 10:58 AM EDT ----- MRSA abscess, resistant to doxycycline and sulfa antibiotics.  She needs to stop the doxycycline and send in clindamycin 450 mg PO bid x 7 days.  Risk GI upset, diarrhea, call clinic if these symptoms are severe.  MRSA is a highly contagious bacterial infection.  Take oral antibiotic as prescribed.  After cleansing infected area, apply mupirocin ointment and cover with bandaid twice daily.  Frequent hand washing is recommended and wash hands after wound care.  Recommend OTC Hibiclens as body wash (including skin folds, avoiding face/eye/ear area) daily in shower, let sit couple minutes prior to rinsing.  Continue clindamycin foam twice daily to aas body. Apply mupirocin ointment in the nostrils bid 7-10 days.  Soak toothbrush in Listerine nightly. Gargle with Listerine mouthwash daily. Do not reuse washcloths. Wash clothes and towels after 1 use. Do not share towels. Disinfect combs/brushes.   If she gets another abscess, it should be cultured again to r/out staph recurrence.  Hidradenitis abcesses are not caused by infection/bacteria and are instead due to inflammation of the sweat gland.  So it may be she has been having recurring staph abcesses and not HS abscesses.  - please call patient

## 2022-03-27 ENCOUNTER — Ambulatory Visit
Admission: RE | Admit: 2022-03-27 | Discharge: 2022-03-27 | Disposition: A | Discharge status: Home / Self Care | Payer: 59 | Source: Ambulatory Visit | Attending: Obstetrics and Gynecology | Admitting: Obstetrics and Gynecology

## 2022-03-27 DIAGNOSIS — Z1231 Encounter for screening mammogram for malignant neoplasm of breast: Secondary | ICD-10-CM | POA: Insufficient documentation

## 2022-03-27 DIAGNOSIS — Z9189 Other specified personal risk factors, not elsewhere classified: Secondary | ICD-10-CM | POA: Diagnosis present

## 2022-03-28 ENCOUNTER — Other Ambulatory Visit: Payer: Self-pay | Admitting: Obstetrics and Gynecology

## 2022-03-28 DIAGNOSIS — N63 Unspecified lump in unspecified breast: Secondary | ICD-10-CM

## 2022-03-28 DIAGNOSIS — R928 Other abnormal and inconclusive findings on diagnostic imaging of breast: Secondary | ICD-10-CM

## 2022-04-04 ENCOUNTER — Encounter: Payer: Self-pay | Admitting: Dermatology

## 2022-04-10 ENCOUNTER — Ambulatory Visit
Admission: RE | Admit: 2022-04-10 | Discharge: 2022-04-10 | Disposition: A | Discharge status: Home / Self Care | Payer: 59 | Source: Ambulatory Visit | Attending: Obstetrics and Gynecology | Admitting: Obstetrics and Gynecology

## 2022-04-10 DIAGNOSIS — N63 Unspecified lump in unspecified breast: Secondary | ICD-10-CM | POA: Diagnosis present

## 2022-04-10 DIAGNOSIS — R928 Other abnormal and inconclusive findings on diagnostic imaging of breast: Secondary | ICD-10-CM

## 2022-04-25 ENCOUNTER — Telehealth: Payer: Self-pay | Admitting: Obstetrics and Gynecology

## 2022-04-25 NOTE — Telephone Encounter (Signed)
Reached out to patient to get her scheduled for an annual exam. Pt states she would call the office for scheduling of this appointment sometime in October as she is due to go out of town soon.

## 2022-04-25 NOTE — Telephone Encounter (Signed)
Patient called back today to schedule for annual exam. Patient has been scheduled for May 21, 2022 at 8am

## 2022-05-10 ENCOUNTER — Encounter: Payer: Self-pay | Admitting: Obstetrics and Gynecology

## 2022-05-13 HISTORY — PX: OTHER SURGICAL HISTORY: SHX169

## 2022-05-21 ENCOUNTER — Ambulatory Visit (INDEPENDENT_AMBULATORY_CARE_PROVIDER_SITE_OTHER): Payer: 59 | Admitting: Obstetrics and Gynecology

## 2022-05-21 ENCOUNTER — Encounter: Payer: Self-pay | Admitting: Obstetrics and Gynecology

## 2022-05-21 VITALS — BP 111/84 | HR 89 | Resp 16 | Ht 66.0 in | Wt 233.0 lb

## 2022-05-21 DIAGNOSIS — R928 Other abnormal and inconclusive findings on diagnostic imaging of breast: Secondary | ICD-10-CM

## 2022-05-21 DIAGNOSIS — R7303 Prediabetes: Secondary | ICD-10-CM

## 2022-05-21 DIAGNOSIS — Z9189 Other specified personal risk factors, not elsewhere classified: Secondary | ICD-10-CM

## 2022-05-21 DIAGNOSIS — Z01419 Encounter for gynecological examination (general) (routine) without abnormal findings: Secondary | ICD-10-CM

## 2022-05-21 DIAGNOSIS — R638 Other symptoms and signs concerning food and fluid intake: Secondary | ICD-10-CM

## 2022-05-21 NOTE — Patient Instructions (Signed)
Breast Self-Awareness Breast self-awareness is knowing how your breasts look and feel. You need to: Check your breasts on a regular basis. Tell your doctor about any changes. Become familiar with the look and feel of your breasts. This can help you catch a breast problem while it is still small and can be treated. You should do breast self-exams even if you have breast implants. What you need: A mirror. A well-lit room. A pillow or other soft object. How to do a breast self-exam Follow these steps to do a breast self-exam: Look for changes  Take off all the clothes above your waist. Stand in front of a mirror in a room with good lighting. Put your hands down at your sides. Compare your breasts in the mirror. Look for any difference between them, such as: A difference in shape. A difference in size. Wrinkles, dips, and bumps in one breast and not the other. Look at each breast for changes in the skin, such as: Redness. Scaly areas. Skin that has gotten thicker. Dimpling. Open sores (ulcers). Look for changes in your nipples, such as: Fluid coming out of a nipple. Fluid around a nipple. Bleeding. Dimpling. Redness. A nipple that looks pushed in (retracted), or that has changed position. Feel for changes Lie on your back. Feel each breast. To do this: Pick a breast to feel. Place a pillow under the shoulder closest to that breast. Put the arm closest to that breast behind your head. Feel the nipple area of that breast using the hand of your other arm. Feel the area with the pads of your three middle fingers by making small circles with your fingers. Use light, medium, and firm pressure. Continue the overlapping circles, moving downward over the breast. Keep making circles with your fingers. Stop when you feel your ribs. Start making circles with your fingers again, this time going upward until you reach your collarbone. Then, make circles outward across your breast and into your  armpit area. Squeeze your nipple. Check for discharge and lumps. Repeat these steps to check your other breast. Sit or stand in the tub or shower. With soapy water on your skin, feel each breast the same way you did when you were lying down. Write down what you find Writing down what you find can help you remember what to tell your doctor. Write down: What is normal for each breast. Any changes you find in each breast. These include: The kind of changes you find. A tender or painful breast. Any lump you find. Write down its size and where it is. When you last had your monthly period (menstrual cycle). General tips If you are breastfeeding, the best time to check your breasts is after you feed your baby or after you use a breast pump. If you get monthly bleeding, the best time to check your breasts is 5-7 days after your monthly cycle ends. With time, you will become comfortable with the self-exam. You will also start to know if there are changes in your breasts. Contact a doctor if: You see a change in the shape or size of your breasts or nipples. You see a change in the skin of your breast or nipples, such as red or scaly skin. You have fluid coming from your nipples that is not normal. You find a new lump or thick area. You have breast pain. You have any concerns about your breast health. Summary Breast self-awareness includes looking for changes in your breasts and feeling for changes   within your breasts. You should do breast self-awareness in front of a mirror in a well-lit room. If you get monthly periods (menstrual cycles), the best time to check your breasts is 5-7 days after your period ends. Tell your doctor about any changes you see in your breasts. Changes include changes in size, changes on the skin, painful or tender breasts, or fluid from your nipples that is not normal. This information is not intended to replace advice given to you by your health care provider. Make sure  you discuss any questions you have with your health care provider. Document Revised: 05/24/2021 Document Reviewed: 05/24/2021 Elsevier Patient Education  2023 Elsevier Inc. Preventive Care 21-39 Years Old, Female Preventive care refers to lifestyle choices and visits with your health care provider that can promote health and wellness. Preventive care visits are also called wellness exams. What can I expect for my preventive care visit? Counseling During your preventive care visit, your health care provider may ask about your: Medical history, including: Past medical problems. Family medical history. Pregnancy history. Current health, including: Menstrual cycle. Method of birth control. Emotional well-being. Home life and relationship well-being. Sexual activity and sexual health. Lifestyle, including: Alcohol, nicotine or tobacco, and drug use. Access to firearms. Diet, exercise, and sleep habits. Work and work environment. Sunscreen use. Safety issues such as seatbelt and bike helmet use. Physical exam Your health care provider may check your: Height and weight. These may be used to calculate your BMI (body mass index). BMI is a measurement that tells if you are at a healthy weight. Waist circumference. This measures the distance around your waistline. This measurement also tells if you are at a healthy weight and may help predict your risk of certain diseases, such as type 2 diabetes and high blood pressure. Heart rate and blood pressure. Body temperature. Skin for abnormal spots. What immunizations do I need?  Vaccines are usually given at various ages, according to a schedule. Your health care provider will recommend vaccines for you based on your age, medical history, and lifestyle or other factors, such as travel or where you work. What tests do I need? Screening Your health care provider may recommend screening tests for certain conditions. This may include: Pelvic exam  and Pap test. Lipid and cholesterol levels. Diabetes screening. This is done by checking your blood sugar (glucose) after you have not eaten for a while (fasting). Hepatitis B test. Hepatitis C test. HIV (human immunodeficiency virus) test. STI (sexually transmitted infection) testing, if you are at risk. BRCA-related cancer screening. This may be done if you have a family history of breast, ovarian, tubal, or peritoneal cancers. Talk with your health care provider about your test results, treatment options, and if necessary, the need for more tests. Follow these instructions at home: Eating and drinking  Eat a healthy diet that includes fresh fruits and vegetables, whole grains, lean protein, and low-fat dairy products. Take vitamin and mineral supplements as recommended by your health care provider. Do not drink alcohol if: Your health care provider tells you not to drink. You are pregnant, may be pregnant, or are planning to become pregnant. If you drink alcohol: Limit how much you have to 0-1 drink a day. Know how much alcohol is in your drink. In the [place] one drink equals one 12 oz bottle of beer (355 mL), one 5 oz glass of wine (148 mL), or one 1 oz glass of hard liquor (44 mL). Lifestyle Brush your teeth every morning and   night with fluoride toothpaste. Floss one time each day. Exercise for at least 30 minutes 5 or more days each week. Do not use any products that contain nicotine or tobacco. These products include cigarettes, chewing tobacco, and vaping devices, such as e-cigarettes. If you need help quitting, ask your health care provider. Do not use drugs. If you are sexually active, practice safe sex. Use a condom or other form of protection to prevent STIs. If you do not wish to become pregnant, use a form of birth control. If you plan to become pregnant, see your health care provider for a prepregnancy visit. Find healthy ways to manage stress, such as: Meditation, yoga, or  listening to music. Journaling. Talking to a trusted person. Spending time with friends and family. Minimize exposure to UV radiation to reduce your risk of skin cancer. Safety Always wear your seat belt while driving or riding in a vehicle. Do not drive: If you have been drinking alcohol. Do not ride with someone who has been drinking. If you have been using any mind-altering substances or drugs. While texting. When you are tired or distracted. Wear a helmet and other protective equipment during sports activities. If you have firearms in your house, make sure you follow all gun safety procedures. Seek help if you have been physically or sexually abused. What's next? Go to your health care provider once a year for an annual wellness visit. Ask your health care provider how often you should have your eyes and teeth checked. Stay up to date on all vaccines. This information is not intended to replace advice given to you by your health care provider. Make sure you discuss any questions you have with your health care provider. Document Revised: 01/17/2021 Document Reviewed: 01/17/2021 Elsevier Patient Education  2023 Elsevier Inc.  

## 2022-05-21 NOTE — Progress Notes (Signed)
GYNECOLOGY ANNUAL PHYSICAL EXAM PROGRESS NOTE  Subjective:    Shelia Daniels is a 36 y.o. G25P1001 female who presents for an annual exam. The patient has no complaints today. The patient is not sexually active. The patient participates in regular exercise: no. Has the patient ever been transfused or tattooed?: yes. The patient reports that there is not domestic violence in her life.   Patient currently with left ortho boot, ambulating with crutches due to broken ankle. Will utilize x 1 month.   Menstrual History: Menarche age: 1 No LMP recorded. (Menstrual status: IUD).    Gynecologic History:  Contraception: IUD (inserted 2020) History of STI's: Denies Last Pap: 10/01/2019. Results were: normal.  Denies/Notes h/o abnormal pap smears. Last mammogram: 03/27/2022. Results were: abnormal - BIRADS 0, Further evaluation is suggested for a possible mass in the right breast.  Diagnostic mammo and [place] on 04/10/2022 - Probably benign right breast mass, 6 x 5 x 3 mm, likely fibroadenoma, recommend f/u in 6 months.     [facility] - 05/21/22 0824       Pregnancy Intention Screening   Does the patient want to become pregnant in the next year? No    Does the patient's partner want to become pregnant in the next year? No    Would the patient like to discuss contraceptive options today? No      Contraception Wrap Up   Current Method IUD or IUS    End Method IUD or IUS    Contraception Counseling Provided No            The pregnancy intention screening data noted above was reviewed. Potential methods of contraception were discussed. The patient elected to proceed with IUD or IUS.   OB History  Gravida Para Term Preterm AB Living  1 1 1  0 0 1  SAB IAB Ectopic Multiple Live Births  0 0 0 0 1    # Outcome Date GA Lbr Len/2nd Weight Sex Delivery Anes PTL Lv  1 Term 02/24/19 [redacted]w[redacted]d/ 00:37 8 lb 0.4 oz (3.64 kg) F Vag-Spont EPI  LIV     Name: [place]    Apgar1: 8  Apgar5: 9    Past  Medical History:  Diagnosis Date   Family history of breast cancer    Genetic testing 12/2015   My Risk/BRCA neg   Increased risk of breast cancer 12/2015   IBIS=21.7%    Past Surgical History:  Procedure Laterality Date   INTRAUTERINE DEVICE (IUD) INSERTION  05/2014   Mirena    Family History  Problem Relation Age of Onset   Breast cancer Paternal Grandmother 433  Pancreatic cancer Paternal Grandmother    Diabetes Paternal Grandmother    Diabetes Mother    Hyperlipidemia Mother    Hypertension Mother    Diabetes Father    Heart attack Father    Hyperlipidemia Maternal Uncle    Hypertension Maternal Uncle    Hyperlipidemia Maternal Grandfather    Hypertension Maternal Grandfather    Lymphoma Paternal Grandfather    Ovarian cancer Neg Hx    Colon cancer Neg Hx     Social History   Socioeconomic History   Marital status: Married    Spouse name: Not on file   Number of children: Not on file   Years of education: Not on file   Highest education level: Not on file  Occupational History   Not on file  Tobacco Use   Smoking status: Never  Smokeless tobacco: Never  Vaping Use   Vaping Use: Never used  Substance and Sexual Activity   Alcohol use: Not Currently    Comment: occasional    Drug use: No   Sexual activity: Not Currently    Birth control/protection: I.U.D.  Other Topics Concern   Not on file  Social History Narrative   Not on file   Social Determinants of Health   Financial Resource Strain: Not on file  Food Insecurity: Not on file  Transportation Needs: Not on file  Physical Activity: Not on file  Stress: Not on file  Social Connections: Moderately Isolated (02/23/2019)   Social Connection and Isolation Panel [NHANES]    Frequency of Communication with Friends and Family: Never    Frequency of Social Gatherings with Friends and Family: Never    Attends Religious Services: Never    Marine scientist or Organizations: Yes    Attends English as a second language teacher Meetings: 1 to 4 times per year    Marital Status: Married  Human resources officer Violence: Not At Risk (02/23/2019)   Humiliation, Afraid, Rape, and Kick questionnaire    Fear of Current or Ex-Partner: No    Emotionally Abused: No    Physically Abused: No    Sexually Abused: No    Current Outpatient Medications on File Prior to Visit  Medication Sig Dispense Refill   Clindamycin Phosphate foam Apply to affected areas once daily after shower. 50 g 3   fluticasone (FLONASE) 50 MCG/ACT nasal spray Place 2 sprays into both nostrils as needed for allergies or rhinitis.     levonorgestrel (MIRENA) 20 MCG/24HR IUD 1 each by Intrauterine route once.     loratadine (CLARITIN) 10 MG tablet Take 10 mg by mouth as needed.      mupirocin ointment (BACTROBAN) 2 % Apply to open areas and cover twice daily until improved. 22 g 1   Vitamin D, Ergocalciferol, (DRISDOL) 1.25 MG (50000 UNIT) CAPS capsule TAKE 1 CAPSULE EVERY 7 DAYS 4 capsule 0   doxycycline (MONODOX) 100 MG capsule Take 1 capsule (100 mg total) by mouth 2 (two) times daily. 60 capsule 2   No current facility-administered medications on file prior to visit.    Allergies  Allergen Reactions   Pollen Extract Other (See Comments)    Sneezing, watery eyes, seasonal     Review of Systems Constitutional: negative for chills, fatigue, fevers and sweats Eyes: negative for irritation, redness and visual disturbance Ears, nose, mouth, throat, and face: negative for hearing loss, nasal congestion, snoring and tinnitus Respiratory: negative for asthma, cough, sputum Cardiovascular: negative for chest pain, dyspnea, exertional chest pressure/discomfort, irregular heart beat, palpitations and syncope Gastrointestinal: negative for abdominal pain, change in bowel habits, nausea and vomiting Genitourinary: negative for abnormal menstrual periods, genital lesions, sexual problems and vaginal discharge, dysuria and urinary  incontinence Integument/breast: negative for breast lump, breast tenderness and nipple discharge Hematologic/lymphatic: negative for bleeding and easy bruising Musculoskeletal:negative for back pain and muscle weakness Neurological: negative for dizziness, headaches, vertigo and weakness Endocrine: negative for diabetic symptoms including polydipsia, polyuria and skin dryness Allergic/Immunologic: negative for hay fever and urticaria      Objective:  Blood pressure 111/84, pulse 89, resp. rate 16, height 5' 6"  (1.676 m), weight 233 lb (105.7 kg), currently breastfeeding.  Body mass index is 37.61 kg/m.  General Appearance:    Alert, cooperative, no distress, appears stated age, moderate obesity  Head:    Normocephalic, without obvious abnormality, atraumatic  Eyes:  PERRL, conjunctiva/corneas clear, EOM's intact, both eyes  Ears:    Normal external ear canals, both ears  Nose:   Nares normal, septum midline, mucosa normal, no drainage or sinus tenderness  Throat:   Lips, mucosa, and tongue normal; teeth and gums normal  Neck:   Supple, symmetrical, trachea midline, no adenopathy; thyroid: no enlargement/tenderness/nodules; no carotid bruit or JVD  Back:     Symmetric, no curvature, ROM normal, no CVA tenderness  Lungs:     Clear to auscultation bilaterally, respirations unlabored  Chest Wall:    No tenderness or deformity   Heart:    Regular rate and rhythm, S1 and S2 normal, no murmur, rub or gallop  Breast Exam:    No tenderness, masses, or nipple abnormality  Abdomen:     Soft, non-tender, bowel sounds active all four quadrants, no masses, no organomegaly.    Genitalia:    Pelvic:external genitalia normal, vagina without lesions, discharge, or tenderness, rectovaginal septum  normal. Cervix normal in appearance, no cervical motion tenderness, IUD in place. No adnexal masses or tenderness.  Uterus normal size, shape, mobile, regular contours, nontender.  Rectal:    Normal external  sphincter.  No hemorrhoids appreciated. Internal exam not done.   Extremities:   Extremities normal, atraumatic, no cyanosis or edema. Left foot in ortho boot.   Pulses:   2+ and symmetric all extremities  Skin:   Skin color, texture, turgor normal, no rashes or lesions  Lymph nodes:   Cervical, supraclavicular, and axillary nodes normal  Neurologic:   CNII-XII intact, normal strength, sensation and reflexes throughout   .  Labs:  Reviewed in Care Everywhere    Assessment:   1. Well woman exam with routine gynecological exam   2. Increased risk of breast cancer   3. Abnormal mammography   4. Prediabetes   5. Increased BMI      Plan:  Blood tests: up to date.  Breast self exam technique reviewed and patient encouraged to perform self-exam monthly. Contraception: IUD. Can remain in place for 8 years.  Discussed healthy lifestyle modifications. Mammogram  reviewed. Is due for f/u breast ultrasound (right) in 6 months. Order placed.   Continue high risk surveillance.  Pap smear up to date. Due in 1 year.  COVID vaccination status: Is eligible for booster.  Prediabetes to be maned by PCP Follow up in 1 year for annual exam   Rubie Maid, MD [facility]

## 2022-05-28 ENCOUNTER — Ambulatory Visit: Payer: 59 | Admitting: Dermatology

## 2022-05-29 ENCOUNTER — Ambulatory Visit: Payer: 59 | Admitting: Dermatology

## 2022-10-09 ENCOUNTER — Other Ambulatory Visit: Payer: 59

## 2022-10-15 ENCOUNTER — Other Ambulatory Visit: Payer: 59

## 2023-06-12 ENCOUNTER — Ambulatory Visit (LOCAL_COMMUNITY_HEALTH_CENTER): Payer: 59

## 2023-06-12 DIAGNOSIS — Z23 Encounter for immunization: Secondary | ICD-10-CM | POA: Diagnosis not present

## 2023-06-12 DIAGNOSIS — Z719 Counseling, unspecified: Secondary | ICD-10-CM

## 2023-06-12 NOTE — Progress Notes (Signed)
In clinic requesting Pfizer covid vaccine. Eligible per [facility]. Given VIS and agreed for Comirnaty 12y+, yr 2024-2025. Administered, monitored for 15 min without problems. Given [facility] copy, explained and understood. M.Kyion Gautier, LPN.

## 2023-07-15 ENCOUNTER — Other Ambulatory Visit: Payer: Self-pay | Admitting: Certified Nurse Midwife

## 2023-07-15 ENCOUNTER — Ambulatory Visit (INDEPENDENT_AMBULATORY_CARE_PROVIDER_SITE_OTHER): Payer: 59 | Admitting: Certified Nurse Midwife

## 2023-07-15 ENCOUNTER — Encounter: Payer: Self-pay | Admitting: Certified Nurse Midwife

## 2023-07-15 ENCOUNTER — Ambulatory Visit: Payer: 59 | Admitting: Obstetrics and Gynecology

## 2023-07-15 ENCOUNTER — Other Ambulatory Visit (HOSPITAL_COMMUNITY)
Admission: RE | Admit: 2023-07-15 | Discharge: 2023-07-15 | Disposition: A | Discharge status: Home / Self Care | Payer: 59 | Source: Ambulatory Visit | Attending: Certified Nurse Midwife | Admitting: Certified Nurse Midwife

## 2023-07-15 VITALS — BP 119/81 | HR 79 | Wt 242.7 lb

## 2023-07-15 DIAGNOSIS — Z1151 Encounter for screening for human papillomavirus (HPV): Secondary | ICD-10-CM | POA: Insufficient documentation

## 2023-07-15 DIAGNOSIS — Z124 Encounter for screening for malignant neoplasm of cervix: Secondary | ICD-10-CM | POA: Insufficient documentation

## 2023-07-15 DIAGNOSIS — Z01419 Encounter for gynecological examination (general) (routine) without abnormal findings: Secondary | ICD-10-CM | POA: Insufficient documentation

## 2023-07-15 DIAGNOSIS — Z1231 Encounter for screening mammogram for malignant neoplasm of breast: Secondary | ICD-10-CM

## 2023-07-15 DIAGNOSIS — Z30432 Encounter for removal of intrauterine contraceptive device: Secondary | ICD-10-CM | POA: Diagnosis not present

## 2023-07-15 DIAGNOSIS — N63 Unspecified lump in unspecified breast: Secondary | ICD-10-CM

## 2023-07-17 ENCOUNTER — Encounter: Payer: Self-pay | Admitting: Certified Nurse Midwife

## 2023-07-17 LAB — CYTOLOGY - PAP
Comment: NEGATIVE
Diagnosis: NEGATIVE
High risk HPV: NEGATIVE

## 2023-07-17 NOTE — Patient Instructions (Signed)

## 2023-07-17 NOTE — Progress Notes (Signed)
ANNUAL EXAM Patient name: Shelia Daniels MRN [id number]  Date of birth: 23-Nov-1985 Chief Complaint:   Annual Exam  History of Present Illness:   Shelia Daniels is a 37 y.o. G8P1001 Caucasian female being seen today for a routine annual exam.  Current complaints: desires IUD removal, contemplating pregnancy.  No LMP recorded. (Menstrual status: IUD).   The pregnancy intention screening data noted above was reviewed. Potential methods of contraception were discussed. The patient elected to proceed with No data recorded.      Component Value Date/Time   DIAGPAP  10/01/2019 1645    - Negative for intraepithelial lesion or malignancy (NILM)   HPVHIGH Negative 10/01/2019 1645   ADEQPAP  10/01/2019 1645    Satisfactory for evaluation; transformation zone component PRESENT.       Last pap 2021. Results were: NILM w/ HRHPV negative. H/O abnormal pap: no Last mammogram: 2023. Results were: stable, 76m f/u was recommended. Family h/o breast cancer: yes paternal grandmother Last colonoscopy: n/a. Results were: N/A. Family h/o colorectal cancer: no     05/21/2022    8:23 AM 04/13/2019   11:09 AM 03/25/2018   10:37 AM  Depression screen PHQ 2/9  Decreased Interest 0 0   Down, Depressed, Hopeless 0 0 --  PHQ - 2 Score 0 0   Altered sleeping  0   Tired, decreased energy  1   Change in appetite  0   Feeling bad or failure about yourself   0   Trouble concentrating  0   Moving slowly or fidgety/restless  0   Suicidal thoughts  0   PHQ-9 Score  1   Difficult doing work/chores  Not difficult at all          No data to display           Review of Systems:   Pertinent items are noted in HPI Denies any headaches, blurred vision, fatigue, shortness of breath, chest pain, abdominal pain, abnormal vaginal discharge/itching/odor/irritation, problems with periods, bowel movements, urination, or intercourse unless otherwise stated above. Pertinent History Reviewed:  Reviewed past  medical,surgical, social and family history.  Reviewed problem list, medications and allergies. Physical Assessment:   Vitals:   07/15/23 1324  BP: 119/81  Pulse: 79  Weight: 242 lb 11.2 oz (110.1 kg)  Body mass index is 39.17 kg/m.       Physical Exam Vitals reviewed.  Constitutional:      Appearance: Normal appearance.  HENT:     Head: Normocephalic.  Neck:     Thyroid: No thyroid mass or thyromegaly.  Cardiovascular:     Rate and Rhythm: Normal rate and regular rhythm.     Heart sounds: Normal heart sounds.  Pulmonary:     Effort: Pulmonary effort is normal.     Breath sounds: Normal breath sounds.  Chest:  Breasts:    Tanner Score is 5.     Right: Normal.     Left: Normal.  Abdominal:     General: Abdomen is flat.     Palpations: Abdomen is soft.     Tenderness: There is no abdominal tenderness.  Genitourinary:    General: Normal vulva.     Vagina: Normal.     Cervix: Normal.     Uterus: Normal.      Adnexa: Right adnexa normal and left adnexa normal.     Comments: IUD removal attempted, string off with gentle traction. Assistance requested from Dr. Logan Bores, able to remove Mirena IUD intact with  gentle traction, shown to patient. Musculoskeletal:     Cervical back: Neck supple. No tenderness.  Skin:    General: Skin is warm and dry.  Neurological:     General: No focal deficit present.     Mental Status: She is alert and oriented to person, place, and time.  Psychiatric:        Mood and Affect: Mood normal.        Behavior: Behavior normal.      No results found for this or any previous visit (from the past 24 hours).  Assessment & Plan:  1. Well woman exam with routine gynecological exam (Primary) - Cytology - PAP  2. Encounter for screening for human papillomavirus (HPV) - Cytology - PAP  3. Cervical cancer screening - Cytology - PAP  4. Breast cancer screening by mammogram  5. Encounter for IUD removal   Mammogram: diagnostic mammo and  breast u/s ordered, or sooner if problems Colonoscopy: @ 39yo, or sooner if problems  No orders of the defined types were placed in this encounter.   Meds: No orders of the defined types were placed in this encounter.   Follow-up: Return in about 1 year (around 07/14/2024) for Annual exam.  Dominica Severin, CNM 07/17/2023 12:25 PM

## 2023-07-22 ENCOUNTER — Ambulatory Visit: Payer: 59 | Admitting: Obstetrics and Gynecology

## 2023-08-26 ENCOUNTER — Ambulatory Visit
Admission: RE | Admit: 2023-08-26 | Discharge: 2023-08-26 | Disposition: A | Discharge status: Home / Self Care | Payer: 59 | Source: Ambulatory Visit | Attending: Certified Nurse Midwife | Admitting: Certified Nurse Midwife

## 2023-08-26 DIAGNOSIS — N63 Unspecified lump in unspecified breast: Secondary | ICD-10-CM | POA: Diagnosis present

## 2023-08-26 DIAGNOSIS — Z1231 Encounter for screening mammogram for malignant neoplasm of breast: Secondary | ICD-10-CM | POA: Insufficient documentation
